# Patient Record
Sex: Male | Born: 1960 | Race: White | Hispanic: No | Marital: Married | Smoking: Former smoker
Health system: Southern US, Community
[De-identification: ages and names within clinical notes are randomized; demographics above are authoritative.]

## PROBLEM LIST (undated history)

## (undated) DIAGNOSIS — Z972 Presence of dental prosthetic device (complete) (partial): Secondary | ICD-10-CM

## (undated) DIAGNOSIS — R0683 Snoring: Secondary | ICD-10-CM

## (undated) DIAGNOSIS — J45909 Unspecified asthma, uncomplicated: Secondary | ICD-10-CM

## (undated) DIAGNOSIS — G5601 Carpal tunnel syndrome, right upper limb: Secondary | ICD-10-CM

## (undated) DIAGNOSIS — G542 Cervical root disorders, not elsewhere classified: Secondary | ICD-10-CM

## (undated) DIAGNOSIS — G1221 Amyotrophic lateral sclerosis: Secondary | ICD-10-CM

## (undated) HISTORY — PX: APPENDECTOMY: SHX54

## (undated) HISTORY — DX: Cervical root disorders, not elsewhere classified: G54.2

## (undated) HISTORY — DX: Snoring: R06.83

## (undated) HISTORY — DX: Unspecified asthma, uncomplicated: J45.909

---

## 2012-02-12 ENCOUNTER — Emergency Department (HOSPITAL_BASED_OUTPATIENT_CLINIC_OR_DEPARTMENT_OTHER)
Admission: EM | Admit: 2012-02-12 | Discharge: 2012-02-12 | Disposition: A | Discharge status: Home / Self Care | Payer: BC Managed Care – HMO | Attending: Emergency Medicine | Admitting: Emergency Medicine

## 2012-02-12 ENCOUNTER — Emergency Department (HOSPITAL_BASED_OUTPATIENT_CLINIC_OR_DEPARTMENT_OTHER): Payer: BC Managed Care – HMO

## 2012-02-12 ENCOUNTER — Encounter (HOSPITAL_BASED_OUTPATIENT_CLINIC_OR_DEPARTMENT_OTHER): Payer: Self-pay | Admitting: *Deleted

## 2012-02-12 DIAGNOSIS — H53149 Visual discomfort, unspecified: Secondary | ICD-10-CM | POA: Insufficient documentation

## 2012-02-12 DIAGNOSIS — R51 Headache: Secondary | ICD-10-CM | POA: Insufficient documentation

## 2012-02-12 MED ORDER — KETOROLAC TROMETHAMINE 30 MG/ML IJ SOLN
30.0000 mg | Freq: Once | INTRAMUSCULAR | Status: DC
  Filled 2012-02-12: qty 1

## 2012-02-12 MED ORDER — DEXAMETHASONE SODIUM PHOSPHATE 10 MG/ML IJ SOLN
10.0000 mg | Freq: Once | INTRAMUSCULAR | Status: AC
  Administered 2012-02-12: 10 mg via INTRAVENOUS
  Filled 2012-02-12: qty 1

## 2012-02-12 MED ORDER — DIPHENHYDRAMINE HCL 50 MG/ML IJ SOLN
25.0000 mg | Freq: Once | INTRAMUSCULAR | Status: AC
  Administered 2012-02-12: 25 mg via INTRAVENOUS
  Filled 2012-02-12: qty 1

## 2012-02-12 MED ORDER — ACETAMINOPHEN 325 MG PO TABS
650.0000 mg | ORAL_TABLET | Freq: Once | ORAL | Status: AC
  Administered 2012-02-12: 650 mg via ORAL
  Filled 2012-02-12 (×2): qty 1

## 2012-02-12 MED ORDER — METOCLOPRAMIDE HCL 5 MG/ML IJ SOLN
10.0000 mg | Freq: Once | INTRAMUSCULAR | Status: AC
  Administered 2012-02-12: 10 mg via INTRAVENOUS
  Filled 2012-02-12: qty 2

## 2012-02-12 MED ORDER — SODIUM CHLORIDE 0.9 % IV BOLUS (SEPSIS)
1000.0000 mL | Freq: Once | INTRAVENOUS | Status: AC
  Administered 2012-02-12: 1000 mL via INTRAVENOUS

## 2012-02-12 NOTE — ED Notes (Signed)
Pt. Was taken to CT scan and RN Earlene Plater explained to Pt. And Pt. Wife about going to CT scanner before pain meds due to he had taken Tylenol at 4pm

## 2012-02-12 NOTE — ED Provider Notes (Signed)
Medical screening examination/treatment/procedure(s) were performed by non-physician practitioner and as supervising physician I was immediately available for consultation/collaboration.  Vitali Seibert K Linker, MD 02/12/12 2215 

## 2012-02-12 NOTE — ED Provider Notes (Signed)
History     CSN: [id number]  Arrival date & time 02/12/12  1729   First MD Initiated Contact with Patient 02/12/12 1854      8:00 PM HPI Patient reports severe headache that began during intercourse. Reports pain is frontal and described as throbbing. States associated with photophobia. Reports similar headache several days ago,but not during intercourse . Denies fever, tick bite, neck pain, nausea, vomiting, abdominal pain, chest pain, shortness of breath. Patient is a 51 y.o. male presenting with headaches. The history is provided by the patient.  Headache  This is a new problem. The current episode started 1 to 2 hours ago. The problem occurs constantly. The problem has not changed since onset.The headache is associated with intercourse. The pain is located in the frontal region. The quality of the pain is described as throbbing. The pain is severe. The pain does not radiate. Pertinent negatives include no fever, no malaise/fatigue, no near-syncope, no shortness of breath, no nausea and no vomiting. He has tried acetaminophen for the symptoms. The treatment provided no relief.    History reviewed. No pertinent past medical history.  Past Surgical History  Procedure Date  . Appendectomy     History reviewed. No pertinent family history.  History  Substance Use Topics  . Smoking status: Never Smoker   . Smokeless tobacco: Not on file  . Alcohol Use: No      Review of Systems  Constitutional: Negative for fever, chills, malaise/fatigue and diaphoresis.  HENT: Negative for ear pain, congestion, sore throat, rhinorrhea, neck pain, neck stiffness and sinus pressure.   Respiratory: Negative for shortness of breath.   Cardiovascular: Negative for chest pain and near-syncope.  Gastrointestinal: Negative for nausea and vomiting.  Neurological: Positive for headaches. Negative for dizziness, seizures, syncope, facial asymmetry, speech difficulty, weakness, light-headedness and  numbness.  All other systems reviewed and are negative.    Allergies  Review of patient's allergies indicates no known allergies.  Home Medications   Current Outpatient Rx  Name Route Sig Dispense Refill  . TYLENOL ARTHRITIS EXT RELIEF PO Oral Take 2 tablets by mouth daily as needed. Patient used this medication for his headache.    . CETIRIZINE HCL 10 MG PO TABS Oral Take 10 mg by mouth daily. Patient uses this medication for his allergies.    . IBUPROFEN 200 MG PO TABS Oral Take 400 mg by mouth every 6 (six) hours as needed. Patient uses this medication for pain.      BP 149/94  Pulse 74  Temp(Src) 97.6 F (36.4 C) (Oral)  Resp 20  Ht 5\' 9"  (1.753 m)  Wt 210 lb (95.255 kg)  BMI 31.01 kg/m2  SpO2 99%  Physical Exam  Constitutional: He is oriented to person, place, and time. He appears well-developed and well-nourished.  HENT:  Head: Normocephalic and atraumatic.  Right Ear: External ear normal.  Left Ear: External ear normal.  Nose: Nose normal.  Mouth/Throat: Oropharynx is clear and moist. No oropharyngeal exudate.  Eyes: Conjunctivae and EOM are normal. Pupils are equal, round, and reactive to light. Right eye exhibits no discharge. Left eye exhibits no discharge.  Neck: Normal range of motion. Neck supple. No spinous process tenderness and no muscular tenderness present. No rigidity. Normal range of motion present. No Brudzinski's sign and no Kernig's sign noted.  Cardiovascular: Normal rate, regular rhythm and normal heart sounds.   Pulmonary/Chest: Effort normal and breath sounds normal.  Abdominal: Soft. Bowel sounds are normal.  Neurological: He  is alert and oriented to person, place, and time. No cranial nerve deficit (Tested CN III-XII). Coordination (normal finger to nose and no nystagmus. ) normal.  Skin: Skin is warm and dry. No rash noted. No erythema. No pallor.  Psychiatric: He has a normal mood and affect. His behavior is normal.    ED Course  Procedures    Ct Head Wo Contrast  02/12/2012  *RADIOLOGY REPORT*  Clinical Data:  Severe headache for 1 hour.  CT HEAD WITHOUT CONTRAST  Technique:  Contiguous axial images were obtained from the base of the skull through the vertex without contrast  Comparison:  None.  Findings:  The brain has a normal appearance without evidence for hemorrhage, acute infarction, hydrocephalus, or mass lesion.  There is no extra axial fluid collection.  The skull and paranasal sinuses are normal.  IMPRESSION: Normal CT of the head without contrast.  Original Report Authenticated By: Elsie Stain, M.D.      MDM  9:59 PM  Reports symptoms controlled with reglan, decadron and benadryl. Pain is a 1/10. Is ready for discharge. Advised return to ED for worsening symptoms. Otherwise advised he should followup with primary care physician for further evaluation of headaches if needed    Thomasene Lot, PA-C 02/12/12 2202

## 2012-02-12 NOTE — ED Notes (Signed)
Pt has a hx of an occasional H/A that is relieved with Tylenol. This one came on suddenly about an hr ago. Seen at [facility] and sent here. When he tilts his head back, pain is relieved. "Can feel pulse in temples" Denies other s/s. PERL

## 2012-02-12 NOTE — ED Notes (Signed)
Wife of pt. Asked for pain meds for Pt.   RN Earlene Plater to inform Charge Rn who will inform the ED Dr.

## 2012-02-12 NOTE — Discharge Instructions (Signed)

## 2012-03-19 DIAGNOSIS — G5601 Carpal tunnel syndrome, right upper limb: Secondary | ICD-10-CM

## 2012-03-19 HISTORY — DX: Carpal tunnel syndrome, right upper limb: G56.01

## 2012-03-30 ENCOUNTER — Other Ambulatory Visit: Payer: Self-pay | Admitting: Orthopedic Surgery

## 2012-04-02 ENCOUNTER — Encounter (HOSPITAL_BASED_OUTPATIENT_CLINIC_OR_DEPARTMENT_OTHER): Payer: Self-pay | Admitting: *Deleted

## 2012-04-06 ENCOUNTER — Encounter (HOSPITAL_BASED_OUTPATIENT_CLINIC_OR_DEPARTMENT_OTHER): Payer: Self-pay | Admitting: *Deleted

## 2012-04-06 ENCOUNTER — Encounter (HOSPITAL_BASED_OUTPATIENT_CLINIC_OR_DEPARTMENT_OTHER): Payer: Self-pay | Admitting: Anesthesiology

## 2012-04-06 ENCOUNTER — Encounter (HOSPITAL_BASED_OUTPATIENT_CLINIC_OR_DEPARTMENT_OTHER)
Admission: RE | Disposition: A | Discharge status: Home / Self Care | Payer: Self-pay | Source: Ambulatory Visit | Attending: Orthopedic Surgery

## 2012-04-06 ENCOUNTER — Ambulatory Visit (HOSPITAL_BASED_OUTPATIENT_CLINIC_OR_DEPARTMENT_OTHER)
Admission: RE | Admit: 2012-04-06 | Discharge: 2012-04-06 | Disposition: A | Discharge status: Home / Self Care | Payer: BC Managed Care – HMO | Source: Ambulatory Visit | Attending: Orthopedic Surgery | Admitting: Orthopedic Surgery

## 2012-04-06 ENCOUNTER — Ambulatory Visit (HOSPITAL_BASED_OUTPATIENT_CLINIC_OR_DEPARTMENT_OTHER): Payer: BC Managed Care – HMO | Admitting: Anesthesiology

## 2012-04-06 DIAGNOSIS — G5600 Carpal tunnel syndrome, unspecified upper limb: Secondary | ICD-10-CM | POA: Insufficient documentation

## 2012-04-06 HISTORY — PX: CARPAL TUNNEL RELEASE: SHX101

## 2012-04-06 HISTORY — DX: Carpal tunnel syndrome, right upper limb: G56.01

## 2012-04-06 HISTORY — DX: Snoring: R06.83

## 2012-04-06 HISTORY — DX: Presence of dental prosthetic device (complete) (partial): Z97.2

## 2012-04-06 SURGERY — CARPAL TUNNEL RELEASE
Anesthesia: General | Site: Hand | Laterality: Right | Wound class: Clean

## 2012-04-06 MED ORDER — MIDAZOLAM HCL 5 MG/5ML IJ SOLN
INTRAMUSCULAR | Status: DC | PRN
  Administered 2012-04-06: 2 mg via INTRAVENOUS

## 2012-04-06 MED ORDER — OXYCODONE-ACETAMINOPHEN 5-325 MG PO TABS: ORAL_TABLET | ORAL | Status: AC

## 2012-04-06 MED ORDER — FENTANYL CITRATE 0.05 MG/ML IJ SOLN
INTRAMUSCULAR | Status: DC | PRN
  Administered 2012-04-06: 50 ug via INTRAVENOUS

## 2012-04-06 MED ORDER — METOCLOPRAMIDE HCL 5 MG/ML IJ SOLN: 10.0000 mg | Freq: Once | INTRAMUSCULAR | Status: DC | PRN

## 2012-04-06 MED ORDER — METOCLOPRAMIDE HCL 5 MG/ML IJ SOLN
INTRAMUSCULAR | Status: DC | PRN
  Administered 2012-04-06: 10 mg via INTRAVENOUS

## 2012-04-06 MED ORDER — LIDOCAINE HCL (CARDIAC) 20 MG/ML IV SOLN
INTRAVENOUS | Status: DC | PRN
  Administered 2012-04-06: 40 mg via INTRAVENOUS

## 2012-04-06 MED ORDER — LACTATED RINGERS IV SOLN
INTRAVENOUS | Status: DC
  Administered 2012-04-06 (×4): via INTRAVENOUS

## 2012-04-06 MED ORDER — PROPOFOL 10 MG/ML IV EMUL
INTRAVENOUS | Status: DC | PRN
  Administered 2012-04-06: 220 mg via INTRAVENOUS

## 2012-04-06 MED ORDER — LIDOCAINE HCL 2 % IJ SOLN
INTRAMUSCULAR | Status: DC | PRN
  Administered 2012-04-06: 2 mL

## 2012-04-06 MED ORDER — OXYCODONE HCL 5 MG PO TABS: 5.0000 mg | ORAL_TABLET | Freq: Once | ORAL | Status: DC | PRN

## 2012-04-06 MED ORDER — ONDANSETRON HCL 4 MG/2ML IJ SOLN
INTRAMUSCULAR | Status: DC | PRN
  Administered 2012-04-06: 4 mg via INTRAVENOUS

## 2012-04-06 MED ORDER — DEXAMETHASONE SODIUM PHOSPHATE 4 MG/ML IJ SOLN
INTRAMUSCULAR | Status: DC | PRN
  Administered 2012-04-06: 10 mg via INTRAVENOUS

## 2012-04-06 MED ORDER — CHLORHEXIDINE GLUCONATE 4 % EX LIQD: 60.0000 mL | Freq: Once | CUTANEOUS | Status: DC

## 2012-04-06 MED ORDER — OXYCODONE HCL 5 MG/5ML PO SOLN: 5.0000 mg | Freq: Once | ORAL | Status: DC | PRN

## 2012-04-06 MED ORDER — HYDROMORPHONE HCL PF 1 MG/ML IJ SOLN: 0.2500 mg | INTRAMUSCULAR | Status: DC | PRN

## 2012-04-06 SURGICAL SUPPLY — 36 items
BANDAGE ADHESIVE 1X3 (GAUZE/BANDAGES/DRESSINGS) IMPLANT
BANDAGE ELASTIC 3 VELCRO ST LF (GAUZE/BANDAGES/DRESSINGS) ×2 IMPLANT
BLADE SURG 15 STRL LF DISP TIS (BLADE) ×1 IMPLANT
BLADE SURG 15 STRL SS (BLADE) ×1
BNDG ESMARK 4X9 LF (GAUZE/BANDAGES/DRESSINGS) IMPLANT
BRUSH SCRUB EZ PLAIN DRY (MISCELLANEOUS) ×2 IMPLANT
CLOTH BEACON ORANGE TIMEOUT ST (SAFETY) ×2 IMPLANT
CORDS BIPOLAR (ELECTRODE) ×2 IMPLANT
COVER MAYO STAND STRL (DRAPES) ×2 IMPLANT
COVER TABLE BACK 60X90 (DRAPES) ×2 IMPLANT
CUFF TOURNIQUET SINGLE 18IN (TOURNIQUET CUFF) IMPLANT
DECANTER SPIKE VIAL GLASS SM (MISCELLANEOUS) IMPLANT
DRAPE EXTREMITY T 121X128X90 (DRAPE) ×2 IMPLANT
DRAPE SURG 17X23 STRL (DRAPES) ×2 IMPLANT
GLOVE BIOGEL M STRL SZ7.5 (GLOVE) ×2 IMPLANT
GLOVE ECLIPSE 6.5 STRL STRAW (GLOVE) ×2 IMPLANT
GLOVE ORTHO TXT STRL SZ7.5 (GLOVE) ×2 IMPLANT
GOWN PREVENTION PLUS XLARGE (GOWN DISPOSABLE) ×2 IMPLANT
GOWN PREVENTION PLUS XXLARGE (GOWN DISPOSABLE) ×4 IMPLANT
NEEDLE 27GAX1X1/2 (NEEDLE) IMPLANT
PACK BASIN DAY SURGERY FS (CUSTOM PROCEDURE TRAY) ×2 IMPLANT
PAD CAST 3X4 CTTN HI CHSV (CAST SUPPLIES) ×1 IMPLANT
PADDING CAST ABS 4INX4YD NS (CAST SUPPLIES) ×1
PADDING CAST ABS COTTON 4X4 ST (CAST SUPPLIES) ×1 IMPLANT
PADDING CAST COTTON 3X4 STRL (CAST SUPPLIES) ×1
SPLINT PLASTER CAST XFAST 3X15 (CAST SUPPLIES) ×4 IMPLANT
SPLINT PLASTER XTRA FASTSET 3X (CAST SUPPLIES) ×4
SPONGE GAUZE 4X4 12PLY (GAUZE/BANDAGES/DRESSINGS) ×2 IMPLANT
STOCKINETTE 4X48 STRL (DRAPES) ×2 IMPLANT
STRIP CLOSURE SKIN 1/2X4 (GAUZE/BANDAGES/DRESSINGS) ×2 IMPLANT
SUT PROLENE 3 0 PS 2 (SUTURE) ×2 IMPLANT
SYR 3ML 23GX1 SAFETY (SYRINGE) IMPLANT
SYR CONTROL 10ML LL (SYRINGE) IMPLANT
TRAY DSU PREP LF (CUSTOM PROCEDURE TRAY) ×2 IMPLANT
UNDERPAD 30X30 INCONTINENT (UNDERPADS AND DIAPERS) ×2 IMPLANT
WATER STERILE IRR 1000ML POUR (IV SOLUTION) ×2 IMPLANT

## 2012-04-06 NOTE — Anesthesia Procedure Notes (Signed)
Procedure Name: LMA Insertion Date/Time: 04/06/2012 7:58 AM Performed by: Gar Gibbon Pre-anesthesia Checklist: Patient identified, Emergency Drugs available, Suction available and Patient being monitored Patient Re-evaluated:Patient Re-evaluated prior to inductionOxygen Delivery Method: Circle System Utilized Preoxygenation: Pre-oxygenation with 100% oxygen Intubation Type: IV induction Ventilation: Mask ventilation without difficulty LMA: LMA inserted LMA Size: 4.0 Number of attempts: 1 Airway Equipment and Method: bite block Placement Confirmation: positive ETCO2 Tube secured with: Tape Dental Injury: Teeth and Oropharynx as per pre-operative assessment

## 2012-04-06 NOTE — Transfer of Care (Signed)
Immediate Anesthesia Transfer of Care Note  Patient: James Munoz  Procedure(s) Performed: Procedure(s) (LRB): CARPAL TUNNEL RELEASE (Right)  Patient Location: PACU  Anesthesia Type: General  Level of Consciousness: sedated  Airway & Oxygen Therapy: Patient Spontanous Breathing and Patient connected to face mask oxygen  Post-op Assessment: Report given to PACU RN and Post -op Vital signs reviewed and stable  Post vital signs: Reviewed and stable  Complications: No apparent anesthesia complications

## 2012-04-06 NOTE — Anesthesia Postprocedure Evaluation (Signed)
Anesthesia Post Note  Patient: James Munoz  Procedure(s) Performed: Procedure(s) (LRB): CARPAL TUNNEL RELEASE (Right)  Anesthesia type: General  Patient location: PACU  Post pain: Pain level controlled  Post assessment: Patient's Cardiovascular Status Stable  Last Vitals:  Filed Vitals:   04/06/12 1000  BP: 108/74  Pulse: 60  Temp:   Resp: 14    Post vital signs: Reviewed and stable  Level of consciousness: alert  Complications: No apparent anesthesia complications

## 2012-04-06 NOTE — H&P (Signed)
James Munoz is an 51 y.o. male.   Chief Complaint: Right hand numbness. HPI: 51 year old male with median numbness.  NCV positive for carpal tunnel syndrome.  Past Medical History  Diagnosis Date  . Dental bridge present     lower  . Snores     denies apnea or waking up gasping/choking  . Carpal tunnel syndrome of right wrist 03/2012    weakness and decreased motor skills right hand    Past Surgical History  Procedure Date  . Appendectomy     History reviewed. No pertinent family history. Social History:  reports that he has never smoked. He has never used smokeless tobacco. He reports that he does not drink alcohol or use illicit drugs.  Allergies: No Known Allergies  Medications Prior to Admission  Medication Sig Dispense Refill  . Acetaminophen (TYLENOL ARTHRITIS EXT RELIEF PO) Take 2 tablets by mouth daily as needed. Patient used this medication for his headache.      Marland Kitchen acetaminophen (TYLENOL) 325 MG tablet Take 650 mg by mouth every 6 (six) hours as needed.      . cetirizine (ZYRTEC) 10 MG tablet Take 10 mg by mouth daily. Patient uses this medication for his allergies.      . Multiple Vitamin (MULTIVITAMIN) tablet Take 1 tablet by mouth daily.      Marland Kitchen ibuprofen (ADVIL,MOTRIN) 200 MG tablet Take 400 mg by mouth every 6 (six) hours as needed. Patient uses this medication for pain.        Results for orders placed during the hospital encounter of 04/06/12 (from the past 48 hour(s))  POCT HEMOGLOBIN-HEMACUE     Status: Normal   Collection Time   04/06/12  6:52 AM      Component Value Range Comment   Hemoglobin 13.8  13.0 - 17.0 g/dL    No results found.  Review of Systems  Constitutional: Negative.   Eyes: Negative.   Cardiovascular: Negative.   Gastrointestinal: Negative.   Genitourinary: Negative.   Musculoskeletal: Negative.   Skin: Negative.   Neurological: Negative.   Endo/Heme/Allergies: Negative.     Blood pressure 135/97, pulse 65, temperature 97.7 F  (36.5 C), temperature source Oral, resp. rate 16, height 5' 9.5" (1.765 m), weight 95.255 kg (210 lb), SpO2 97.00%. Physical Exam  Constitutional: He is oriented to person, place, and time. He appears well-developed and well-nourished.  HENT:  Head: Normocephalic and atraumatic.  Eyes: Conjunctivae are normal. Pupils are equal, round, and reactive to light.  Neck: Normal range of motion.  Cardiovascular: Normal rate and regular rhythm.   Respiratory: Effort normal and breath sounds normal.  GI: Soft. Bowel sounds are normal.  Musculoskeletal:       Right hand numbness in median distribution  Neurological: He is alert and oriented to person, place, and time.  Skin: Skin is warm and dry.  Psychiatric: He has a normal mood and affect.     Assessment/Plan Right carpal tunnel syndrome. Right carpal tunnel release.  Questions invited and answered.  Randell Detter JR,Kamie Korber V 04/06/2012, 7:39 AM

## 2012-04-06 NOTE — Brief Op Note (Signed)
04/06/2012  8:22 AM  PATIENT:  Clarisse Gouge  51 y.o. male  PRE-OPERATIVE DIAGNOSIS:  BILATERAL CARPAL TUNNEL SYNDROME  POST-OPERATIVE DIAGNOSIS:  bilateral carpel tunnel syndrome  PROCEDURE:  Procedure(s) (LRB): CARPAL TUNNEL RELEASE (Right)  SURGEON:  Surgeon(s) and Role:    * Wyn Forster., MD - Primary  PHYSICIAN ASSISTANT:   ASSISTANTS: Surgical assistant   ANESTHESIA:   general  EBL:     BLOOD ADMINISTERED:none  DRAINS: none   LOCAL MEDICATIONS USED:  LIDOCAINE   SPECIMEN:  No Specimen  DISPOSITION OF SPECIMEN:  N/A  COUNTS:  YES  TOURNIQUET:   Total Tourniquet Time Documented: Upper Arm (Right) - 9 minutes  DICTATION: .Other Dictation: Dictation Number [id number]  PLAN OF CARE: Discharge to home after PACU  PATIENT DISPOSITION:  PACU - hemodynamically stable.

## 2012-04-06 NOTE — Op Note (Signed)
[id number] 

## 2012-04-06 NOTE — Op Note (Signed)
NAMEOLYVER, HAWES             ACCOUNT NO.:  [id number]  MEDICAL RECORD NO.:  [id number]  LOCATION:                                 FACILITY:  PHYSICIAN:  Katy Fitch. Akya Fiorello, M.D.      DATE OF BIRTH:  DATE OF PROCEDURE:  04/06/2012 DATE OF DISCHARGE:                              OPERATIVE REPORT   PREOPERATIVE DIAGNOSIS:  Bilateral carpal tunnel syndrome.  POSTOPERATIVE DIAGNOSIS:  Bilateral carpal tunnel syndrome.  OPERATION:  Release of right transverse carpal ligament.  OPERATING SURGEON:  Katy Fitch. Nahal Wanless, MD.  ANESTHESIA:  General by LMA.  SUPERVISING ANESTHESIOLOGIST:  Janetta Hora. Gelene Mink, MD.  INDICATIONS:  James Munoz is a 51 year old gentleman employed by [employer] who was referred through the courtesy of Dr. Rodrigo Ran for evaluation of bilateral hand discomfort and numbness. Clinical examination suggested carpal tunnel syndrome. Electrodiagnostic studies completed by Dr. Phill Myron confirmed significant bilateral median neuropathy.  Due to a failure to respond to nonoperative measures, he is brought to the operating room at this time for release of his right transverse carpal ligament.  Preoperatively, we explained to him the technique of carpal tunnel release.  He understands the potential risks and benefits of surgery. There is always a chance that all of his symptoms will not be relieved. He may have a painful scar.  There is a remote chance of neurovascular injury.  After informed consent, he was brought to the operating room at this time.  PROCEDURE IN DETAIL:  Luismiguel Lamere is brought to room 1 at the [facility] and placed in supine position upon the operating table.  Following the induction of general anesthesia by LMA technique under Dr. Thornton Dales direct supervision, the right arm was prepped with Betadine soap and solution, sterilely draped.  A pneumatic tourniquet was applied to the proximal right  brachium.  Following exsanguination of the right arm with Esmarch bandage, the arterial tourniquet on the proximal brachium was inflated to 220 mmHg. A routine surgical time-out was accomplished followed by proceeding with a short incision in the line of the ring finger and the palm. Subcutaneous tissues were carefully divided revealing the palmar fascia. This was split longitudinally to reveal the common sensory branch of the median nerve and the superficial palmar arch.  The distal margin of the transverse carpal ligament was isolated followed by identification of the median nerve proper.  A Penfield 4 elevator was passed deep to the transverse carpal ligament and scissors passed superficial to the transverse carpal ligament.  After a pathway had been created both superficial and deep with extreme care and direct visualization, we released the transverse carpal ligament along its ulnar border extending into the distal forearm.  The volar arm fascia was released several centimeters into the distal forearm.  There was a small accessory transverse carpal ligament about 3 cm proximal.  This did not appear to be causing significant compression. We did not attempt subcutaneous release of this ligament.  The carpal canal was substantially widened.  The ulnar bursa was fibrotic and opaque.  No masses were identified.  Bleeding points along the margin of the released ligament were electrocauterized with bipolar current followed by repair of  the skin with intradermal 3-0 Prolene and Steri-Strips.  Lidocaine 2% was infiltrated for postoperative analgesia.  Total of 2 mL was utilized.  The hand was then dressed with sterile gauze, sterile Webril, and a volar plaster splint maintaining the wrist in 15 degrees of dorsiflexion.  For aftercare, Mr. Laura is provided a prescription for Percocet 5 mg 1 p.o. q.4-6 hours p.r.n. pain, 20 tablets without a refill.     Katy Fitch Djon Tith,  M.D.     RVS/MEDQ  D:  04/06/2012  T:  04/06/2012  Job:  [id number]

## 2012-04-06 NOTE — Anesthesia Preprocedure Evaluation (Signed)
Anesthesia Evaluation  Patient identified by MRN, date of birth, ID band Patient awake    Reviewed: Allergy & Precautions, H&P , NPO status , Patient's Chart, lab work & pertinent test results, reviewed documented beta blocker date and time   Airway Mallampati: II TM Distance: >3 FB Neck ROM: full    Dental   Pulmonary neg pulmonary ROS,  breath sounds clear to auscultation        Cardiovascular negative cardio ROS  Rhythm:regular     Neuro/Psych  Neuromuscular disease negative psych ROS   GI/Hepatic negative GI ROS, Neg liver ROS,   Endo/Other  negative endocrine ROS  Renal/GU negative Renal ROS  negative genitourinary   Musculoskeletal   Abdominal   Peds  Hematology negative hematology ROS (+)   Anesthesia Other Findings See surgeon's H&P   Reproductive/Obstetrics negative OB ROS                           Anesthesia Physical Anesthesia Plan  ASA: I  Anesthesia Plan: General   Post-op Pain Management:    Induction: Intravenous  Airway Management Planned: LMA  Additional Equipment:   Intra-op Plan:   Post-operative Plan: Extubation in OR  Informed Consent: I have reviewed the patients History and Physical, chart, labs and discussed the procedure including the risks, benefits and alternatives for the proposed anesthesia with the patient or authorized representative who has indicated his/her understanding and acceptance.   Dental Advisory Given  Plan Discussed with: CRNA and Surgeon  Anesthesia Plan Comments:         Anesthesia Quick Evaluation  

## 2012-04-09 ENCOUNTER — Encounter (HOSPITAL_BASED_OUTPATIENT_CLINIC_OR_DEPARTMENT_OTHER): Payer: Self-pay | Admitting: Orthopedic Surgery

## 2012-08-14 ENCOUNTER — Other Ambulatory Visit: Payer: Self-pay | Admitting: Orthopedic Surgery

## 2012-08-15 NOTE — Progress Notes (Signed)
Pt here 7/13 rt ctr-now has cyst and ulnar nerve no labs needed-did well with gen anesth

## 2012-08-20 NOTE — H&P (Signed)
James Munoz is an 51 y.o. male.   Chief Complaint: c/o persistent weakness of thumb opposition right hand S/P right CTR HPI: James Munoz returns for follow-up examination of his right carpal tunnel release performed April 06, 2012.  He is four months post-op.  James Munoz's scar is well healed.  He has normal sensibility in his median innervated fingers.  He reports, however, that he has had persistent impairment of thumb positioning.   Past Medical History  Diagnosis Date  . Dental bridge present     lower  . Snores     denies apnea or waking up gasping/choking  . Carpal tunnel syndrome of right wrist 03/2012    weakness and decreased motor skills right hand    Past Surgical History  Procedure Date  . Appendectomy   . Carpal tunnel release 04/06/2012    Procedure: CARPAL TUNNEL RELEASE;  Surgeon: Wyn Forster., MD;  Location: [facility];  Service: Orthopedics;  Laterality: Right;    No family history on file. Social History:  reports that he has never smoked. He has never used smokeless tobacco. He reports that he does not drink alcohol or use illicit drugs.  Allergies: No Known Allergies  No prescriptions prior to admission    No results found for this or any previous visit (from the past 48 hour(s)).  No results found.   Pertinent items are noted in HPI.  Height 5' 9.5" (1.765 m), weight 95.255 kg (210 lb).  General appearance: alert Head: Normocephalic, without obvious abnormality Neck: supple, symmetrical, trachea midline Resp: clear to auscultation bilaterally Cardio: regular rate and rhythm GI: normal findings: bowel sounds normal Extremities: On exam when he attempts a grasp pattern he has weak opposition. He has normal bulk of his abductor pollicis brevis and flexor pollicis brevis, however, on careful inspection of his hand and various attempts at activating the thenar muscles he does appear to have a degree of atrophy of his opponens pollicis  muscle.  It takes extremely careful examination to discern this. With attempted opposition or lateral pinch there is some muscle in the region of the opponens pollicis, but he does not have the typical distal bulk.   There is no visible fasciculation.  He has negative Hoffman maneuver. He does not have any residual numbness or signs of intrinsic atrophy.   We had Dr. Johna Roles repeat comprehensive EMG nerve conduction evaluation in the median and ulnar nerves.   Compared to the study of June, 2013, James Munoz has an abnormal ulnar conduction velocity across the elbow. He also has slower conduction parameters of his median nerve across the wrist and a decreased median motor amplitude which is a bit puzzling.  Sensory latency studies are slightly better, the lumbrical interosseous difference is worse on the right.  Dr. Johna Roles performed detailed EMG studies of the right flexor pollicis longus, right extensor indices proprius, the pronator quadratus, abductor pollicis brevis, right opponens, right adductor pollicis, right first dorsal interosseous, left abductor digiti minimi and left opponens, all of which were normal.    We are thankful to see that there are no fibrillations or sharp waves that would suggest a possible inadvertent axonotmesis or neurapraxia.     MRI of his right wrist completed at [facility] 08/08/12, interpreted by Corrie Dandy L. Vear Clock, MD.   The MRI raises even more questions.  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 He also has an effusion in  his wrist joint, some edema in his lunate, a cyst on the radial aspect of the lunate consistent with a partial scapholunate interosseous ligament injury.  It is possible that he is developing the early stages of Kienbck's disease or has had a chronic scapholunate instability that has gradually worsened leading to the development of a volar deep juxtaarticular cyst and compression.     Pulses: 2+ and  symmetric Skin: normal Neurologic: Grossly normal except right hand as above.    Assessment/Plan Impression:  Weak opposition right thumb S/P right CTR July 2013, with persistent abnormal nerve conduction study of median nerve post operatively at four months.  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  Detailed EMG of the thenar muscles has revealed normal parameters.  Plan: To the OR for exploration right carpal canal and direct inspection of median nerve and motor branch to thenar muscles. Volar cyst excision and decompression right ulnar nerve.The procedures, risks,benefits and post-op course were discussed with the patient at length and they were in agreement with the plan. A second opinion was advised and scheduled preoperatively.  James Munoz 08/20/2012, 3:52 PM   James Munoz.A-C

## 2012-08-21 ENCOUNTER — Ambulatory Visit (HOSPITAL_BASED_OUTPATIENT_CLINIC_OR_DEPARTMENT_OTHER)
Admission: RE | Admit: 2012-08-21 | Discharge: 2012-08-21 | Disposition: A | Discharge status: Home / Self Care | Payer: BC Managed Care – HMO | Source: Ambulatory Visit | Attending: Orthopedic Surgery | Admitting: Orthopedic Surgery

## 2012-08-21 ENCOUNTER — Encounter (HOSPITAL_BASED_OUTPATIENT_CLINIC_OR_DEPARTMENT_OTHER): Payer: Self-pay | Admitting: Certified Registered Nurse Anesthetist

## 2012-08-21 ENCOUNTER — Encounter (HOSPITAL_BASED_OUTPATIENT_CLINIC_OR_DEPARTMENT_OTHER): Payer: Self-pay | Admitting: Anesthesiology

## 2012-08-21 ENCOUNTER — Encounter (HOSPITAL_BASED_OUTPATIENT_CLINIC_OR_DEPARTMENT_OTHER)
Admission: RE | Disposition: A | Discharge status: Home / Self Care | Payer: Self-pay | Source: Ambulatory Visit | Attending: Orthopedic Surgery

## 2012-08-21 ENCOUNTER — Encounter (HOSPITAL_BASED_OUTPATIENT_CLINIC_OR_DEPARTMENT_OTHER): Payer: Self-pay | Admitting: *Deleted

## 2012-08-21 ENCOUNTER — Ambulatory Visit (HOSPITAL_BASED_OUTPATIENT_CLINIC_OR_DEPARTMENT_OTHER): Payer: BC Managed Care – HMO | Admitting: Anesthesiology

## 2012-08-21 DIAGNOSIS — G5620 Lesion of ulnar nerve, unspecified upper limb: Secondary | ICD-10-CM | POA: Insufficient documentation

## 2012-08-21 DIAGNOSIS — G5610 Other lesions of median nerve, unspecified upper limb: Secondary | ICD-10-CM | POA: Insufficient documentation

## 2012-08-21 DIAGNOSIS — M6749 Ganglion, multiple sites: Secondary | ICD-10-CM | POA: Insufficient documentation

## 2012-08-21 HISTORY — PX: TENDON EXPLORATION: SHX5112

## 2012-08-21 HISTORY — PX: ULNAR NERVE TRANSPOSITION: SHX2595

## 2012-08-21 SURGERY — EXPLORATION, TENDON
Anesthesia: General | Site: Wrist | Laterality: Right | Wound class: Clean

## 2012-08-21 MED ORDER — MIDAZOLAM HCL 2 MG/2ML IJ SOLN: 0.5000 mg | Freq: Once | INTRAMUSCULAR | Status: DC | PRN

## 2012-08-21 MED ORDER — CEFAZOLIN SODIUM-DEXTROSE 2-3 GM-% IV SOLR
2.0000 g | Freq: Once | INTRAVENOUS | Status: AC
  Administered 2012-08-21: 2 g via INTRAVENOUS

## 2012-08-21 MED ORDER — MIDAZOLAM HCL 2 MG/2ML IJ SOLN: 1.0000 mg | INTRAMUSCULAR | Status: DC | PRN

## 2012-08-21 MED ORDER — PROMETHAZINE HCL 25 MG/ML IJ SOLN: 6.2500 mg | INTRAMUSCULAR | Status: DC | PRN

## 2012-08-21 MED ORDER — CHLORHEXIDINE GLUCONATE 4 % EX LIQD: 60.0000 mL | Freq: Once | CUTANEOUS | Status: DC

## 2012-08-21 MED ORDER — MEPERIDINE HCL 25 MG/ML IJ SOLN: 6.2500 mg | INTRAMUSCULAR | Status: DC | PRN

## 2012-08-21 MED ORDER — 0.9 % SODIUM CHLORIDE (POUR BTL) OPTIME
TOPICAL | Status: DC | PRN
  Administered 2012-08-21: 600 mL

## 2012-08-21 MED ORDER — FENTANYL CITRATE 0.05 MG/ML IJ SOLN: 50.0000 ug | INTRAMUSCULAR | Status: DC | PRN

## 2012-08-21 MED ORDER — OXYCODONE HCL 5 MG PO TABS
5.0000 mg | ORAL_TABLET | Freq: Once | ORAL | Status: AC | PRN
  Administered 2012-08-21: 5 mg via ORAL

## 2012-08-21 MED ORDER — HYDROMORPHONE HCL PF 1 MG/ML IJ SOLN
0.2500 mg | INTRAMUSCULAR | Status: DC | PRN
  Administered 2012-08-21: 0.5 mg via INTRAVENOUS

## 2012-08-21 MED ORDER — CEPHALEXIN 500 MG PO CAPS: 500.0000 mg | ORAL_CAPSULE | Freq: Three times a day (TID) | ORAL | Status: DC

## 2012-08-21 MED ORDER — LIDOCAINE HCL 2 % IJ SOLN
INTRAMUSCULAR | Status: DC | PRN
  Administered 2012-08-21: 3 mL

## 2012-08-21 MED ORDER — LIDOCAINE HCL (CARDIAC) 20 MG/ML IV SOLN
INTRAVENOUS | Status: DC | PRN
  Administered 2012-08-21: 40 mg via INTRAVENOUS

## 2012-08-21 MED ORDER — LACTATED RINGERS IV SOLN
INTRAVENOUS | Status: DC
  Administered 2012-08-21: 14:00:00 via INTRAVENOUS
  Administered 2012-08-21: 20 mL/h via INTRAVENOUS

## 2012-08-21 MED ORDER — MIDAZOLAM HCL 5 MG/5ML IJ SOLN
INTRAMUSCULAR | Status: DC | PRN
  Administered 2012-08-21: 2 mg via INTRAVENOUS

## 2012-08-21 MED ORDER — FENTANYL CITRATE 0.05 MG/ML IJ SOLN
INTRAMUSCULAR | Status: DC | PRN
  Administered 2012-08-21: 50 ug via INTRAVENOUS
  Administered 2012-08-21: 25 ug via INTRAVENOUS
  Administered 2012-08-21: 100 ug via INTRAVENOUS
  Administered 2012-08-21: 25 ug via INTRAVENOUS

## 2012-08-21 MED ORDER — DEXAMETHASONE SODIUM PHOSPHATE 4 MG/ML IJ SOLN
INTRAMUSCULAR | Status: DC | PRN
  Administered 2012-08-21: 10 mg via INTRAVENOUS

## 2012-08-21 MED ORDER — OXYCODONE HCL 5 MG/5ML PO SOLN: 5.0000 mg | Freq: Once | ORAL | Status: AC | PRN

## 2012-08-21 MED ORDER — METOCLOPRAMIDE HCL 5 MG/ML IJ SOLN
INTRAMUSCULAR | Status: DC | PRN
  Administered 2012-08-21: 10 mg via INTRAVENOUS

## 2012-08-21 MED ORDER — OXYCODONE-ACETAMINOPHEN 5-325 MG PO TABS: ORAL_TABLET | ORAL | Status: DC

## 2012-08-21 MED ORDER — PROPOFOL 10 MG/ML IV BOLUS
INTRAVENOUS | Status: DC | PRN
  Administered 2012-08-21: 200 mg via INTRAVENOUS

## 2012-08-21 SURGICAL SUPPLY — 45 items
BANDAGE ADHESIVE 1X3 (GAUZE/BANDAGES/DRESSINGS) ×3 IMPLANT
BANDAGE ELASTIC 3 VELCRO ST LF (GAUZE/BANDAGES/DRESSINGS) ×3 IMPLANT
BANDAGE ELASTIC 4 VELCRO ST LF (GAUZE/BANDAGES/DRESSINGS) ×3 IMPLANT
BANDAGE GAUZE ELAST BULKY 4 IN (GAUZE/BANDAGES/DRESSINGS) ×3 IMPLANT
BLADE MINI RND TIP GREEN BEAV (BLADE) IMPLANT
BLADE SURG 15 STRL LF DISP TIS (BLADE) ×2 IMPLANT
BLADE SURG 15 STRL SS (BLADE) ×1
BNDG ESMARK 4X9 LF (GAUZE/BANDAGES/DRESSINGS) ×3 IMPLANT
BRUSH SCRUB EZ PLAIN DRY (MISCELLANEOUS) ×3 IMPLANT
CLOTH BEACON ORANGE TIMEOUT ST (SAFETY) ×3 IMPLANT
CORDS BIPOLAR (ELECTRODE) ×3 IMPLANT
COVER MAYO STAND STRL (DRAPES) ×3 IMPLANT
COVER TABLE BACK 60X90 (DRAPES) ×3 IMPLANT
CUFF TOURNIQUET SINGLE 18IN (TOURNIQUET CUFF) ×3 IMPLANT
DECANTER SPIKE VIAL GLASS SM (MISCELLANEOUS) IMPLANT
DRAPE EXTREMITY T 121X128X90 (DRAPE) ×3 IMPLANT
DRAPE SURG 17X23 STRL (DRAPES) ×3 IMPLANT
DRSG EMULSION OIL 3X3 NADH (GAUZE/BANDAGES/DRESSINGS) ×3 IMPLANT
DRSG TEGADERM 4X4.75 (GAUZE/BANDAGES/DRESSINGS) ×3 IMPLANT
GAUZE SPONGE 4X4 12PLY STRL LF (GAUZE/BANDAGES/DRESSINGS) ×3 IMPLANT
GLOVE BIOGEL M STRL SZ7.5 (GLOVE) ×3 IMPLANT
GLOVE ORTHO TXT STRL SZ7.5 (GLOVE) ×3 IMPLANT
GOWN PREVENTION PLUS XLARGE (GOWN DISPOSABLE) ×3 IMPLANT
GOWN PREVENTION PLUS XXLARGE (GOWN DISPOSABLE) ×6 IMPLANT
LOOP VESSEL MAXI BLUE (MISCELLANEOUS) ×3 IMPLANT
NEEDLE 27GAX1X1/2 (NEEDLE) ×3 IMPLANT
PACK BASIN DAY SURGERY FS (CUSTOM PROCEDURE TRAY) ×3 IMPLANT
PADDING CAST ABS 4INX4YD NS (CAST SUPPLIES) ×1
PADDING CAST ABS COTTON 4X4 ST (CAST SUPPLIES) ×2 IMPLANT
SLEEVE SCD COMPRESS KNEE MED (MISCELLANEOUS) IMPLANT
SPONGE GAUZE 4X4 12PLY (GAUZE/BANDAGES/DRESSINGS) ×3 IMPLANT
STOCKINETTE 4X48 STRL (DRAPES) ×3 IMPLANT
STRIP CLOSURE SKIN 1/2X4 (GAUZE/BANDAGES/DRESSINGS) ×3 IMPLANT
SUT PROLENE 3 0 PS 2 (SUTURE) ×3 IMPLANT
SUT VIC AB 3-0 SH 27 (SUTURE)
SUT VIC AB 3-0 SH 27XBRD (SUTURE) IMPLANT
SUT VIC AB 4-0 P-3 18XBRD (SUTURE) ×2 IMPLANT
SUT VIC AB 4-0 P3 18 (SUTURE) ×1
SUT VICRYL 3-0 RB1 18 ABS (SUTURE) ×3 IMPLANT
SYR 3ML 23GX1 SAFETY (SYRINGE) IMPLANT
SYR BULB 3OZ (MISCELLANEOUS) IMPLANT
SYR CONTROL 10ML LL (SYRINGE) ×3 IMPLANT
TOWEL OR 17X24 6PK STRL BLUE (TOWEL DISPOSABLE) ×9 IMPLANT
UNDERPAD 30X30 INCONTINENT (UNDERPADS AND DIAPERS) ×3 IMPLANT
WATER STERILE IRR 1000ML POUR (IV SOLUTION) ×3 IMPLANT

## 2012-08-21 NOTE — Anesthesia Postprocedure Evaluation (Signed)
  Anesthesia Post-op Note  Patient: James Munoz  Procedure(s) Performed: Procedure(s) (LRB) with comments: TENDON EXPLORATION (Right) - Wide Exploration of Carpal Canal , Cyst Excision Right   ULNAR NERVE DECOMPRESSION/TRANSPOSITION (Right) - Decompression Ulnar Nerve    Patient Location: PACU  Anesthesia Type:General  Level of Consciousness: awake, alert , oriented and patient cooperative  Airway and Oxygen Therapy: Patient Spontanous Breathing  Post-op Pain: mild  Post-op Assessment: Post-op Vital signs reviewed, Patient's Cardiovascular Status Stable, Respiratory Function Stable, Patent Airway, No signs of Nausea or vomiting and Pain level controlled  Post-op Vital Signs: Reviewed and stable  Complications: No apparent anesthesia complications

## 2012-08-21 NOTE — Transfer of Care (Signed)
Immediate Anesthesia Transfer of Care Note  Patient: James Munoz  Procedure(s) Performed: Procedure(s) (LRB) with comments: TENDON EXPLORATION (Right) - Wide Exploration of Carpal Canal , Cyst Excision Right   ULNAR NERVE DECOMPRESSION/TRANSPOSITION (Right) - Decompression Ulnar Nerve    Patient Location: PACU  Anesthesia Type:General  Level of Consciousness: awake, alert , oriented and patient cooperative  Airway & Oxygen Therapy: Patient Spontanous Breathing and Patient connected to face mask oxygen  Post-op Assessment: Report given to PACU RN and Post -op Vital signs reviewed and stable  Post vital signs: Reviewed and stable  Complications: No apparent anesthesia complications

## 2012-08-21 NOTE — Anesthesia Procedure Notes (Signed)
Procedure Name: LMA Insertion Date/Time: 08/21/2012 1:17 PM Performed by: Norva Pavlov Pre-anesthesia Checklist: Patient identified, Emergency Drugs available, Suction available and Patient being monitored Patient Re-evaluated:Patient Re-evaluated prior to inductionOxygen Delivery Method: Circle System Utilized Preoxygenation: Pre-oxygenation with 100% oxygen Intubation Type: IV induction Ventilation: Mask ventilation without difficulty LMA: LMA inserted LMA Size: 4.0 Number of attempts: 1 Airway Equipment and Method: bite block Placement Confirmation: positive ETCO2 Tube secured with: Tape Dental Injury: Teeth and Oropharynx as per pre-operative assessment

## 2012-08-21 NOTE — Op Note (Signed)
[id number] 

## 2012-08-21 NOTE — Anesthesia Preprocedure Evaluation (Signed)
Anesthesia Evaluation  Patient identified by MRN, date of birth, ID band Patient awake    Reviewed: Allergy & Precautions, H&P , NPO status , Patient's Chart, lab work & pertinent test results  History of Anesthesia Complications Negative for: history of anesthetic complications  Airway Mallampati: II TM Distance: >3 FB Neck ROM: Full    Dental  (+) Caps and Dental Advisory Given   Pulmonary neg pulmonary ROS, former smoker,  breath sounds clear to auscultation  Pulmonary exam normal       Cardiovascular negative cardio ROS  Rhythm:Regular Rate:Normal     Neuro/Psych negative neurological ROS  negative psych ROS   GI/Hepatic negative GI ROS, Neg liver ROS,   Endo/Other  negative endocrine ROS  Renal/GU negative Renal ROS     Musculoskeletal   Abdominal   Peds  Hematology negative hematology ROS (+)   Anesthesia Other Findings   Reproductive/Obstetrics                           Anesthesia Physical Anesthesia Plan  ASA: I  Anesthesia Plan: General   Post-op Pain Management:    Induction: Intravenous  Airway Management Planned: LMA  Additional Equipment:   Intra-op Plan:   Post-operative Plan:   Informed Consent: I have reviewed the patients History and Physical, chart, labs and discussed the procedure including the risks, benefits and alternatives for the proposed anesthesia with the patient or authorized representative who has indicated his/her understanding and acceptance.   Dental advisory given  Plan Discussed with: CRNA and Surgeon  Anesthesia Plan Comments: (Patient declines regional anesthesia.  Plan routine monitors, GA- LMA OK)        Anesthesia Quick Evaluation

## 2012-08-21 NOTE — Brief Op Note (Signed)
08/21/2012  2:50 PM  PATIENT:  James Munoz  51 y.o. 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" Ulnar Nerve Impairment Right Elbow, Volar Cyst  PROCEDURE:  Exploration and neurolysis of median nerve from distal forearm to thenar muscles, Ulnar nerve in situ decompression, drainage and curretage of volar myxoid cyst  SURGEON:  Surgeon(s) and Role:    * Wyn Forster., MD - Primary  PHYSICIAN ASSISTANT:   ASSISTANTS: Mallory Shirk.A-C    ANESTHESIA:   general  EBL:  Total I/O In: 1000 [I.V.:1000] Out: -   BLOOD ADMINISTERED:none  DRAINS: none   LOCAL MEDICATIONS USED:  XYLOCAINE   SPECIMEN:  No Specimen  DISPOSITION OF SPECIMEN:  N/A  COUNTS:  correct  TOURNIQUET:   Total Tourniquet Time Documented: Upper Arm (Right) - 49 minutes  DICTATION: .Other Dictation: Dictation Number [id number]  PLAN OF CARE: Discharge to home after PACU  PATIENT DISPOSITION:  PACU - hemodynamically stable.

## 2012-08-22 ENCOUNTER — Encounter (HOSPITAL_BASED_OUTPATIENT_CLINIC_OR_DEPARTMENT_OTHER): Payer: Self-pay | Admitting: Orthopedic Surgery

## 2012-08-22 NOTE — Op Note (Signed)
NAMEMIDAS, DAUGHETY             ACCOUNT NO.:  [id number]  MEDICAL RECORD NO.:  [id number]  LOCATION:                                 FACILITY:  PHYSICIAN:  Katy Fitch. Zaniya Mcaulay, M.D. DATE OF BIRTH:  May 04, 1961  DATE OF PROCEDURE:  08/21/2012 DATE OF DISCHARGE:                              OPERATIVE REPORT   PREOPERATIVE DIAGNOSIS:  Failure to improve median nerve function status post carpal tunnel release, April 06, 2012 with EMG nerve conduction study documenting BK in motor and sensory latencies of median nerve, and normal EMG of the thenar muscles, also worsening of ulnar nerve conduction parameters across right elbow.  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  OPERATION: 1. Exploration and neurolysis of median nerve from distal forearm to     the thenar motor branch to mid palm, identifying profound scarring     at the level of transverse carpal ligament, likely due to prior     postoperative bleed with considerable neurodesis. 2. 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  This was debrided. 3. 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  OPERATING SURGEON:  Katy Fitch. Annalisse Minkoff, MD  ASSISTANT:  Marveen Reeks. Dasnoit, PA-C.  ANESTHESIA:  General by LMA.  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  Clinical examination and electrodiagnostic studies confirmed bilateral moderate carpal tunnel syndrome.  We recommended simple carpal tunnel release.  We proceeded with surgery on the right side, April 06, 2012. Postoperatively, he had recovery of his sensibility in his fingers but had some soreness at his thenar eminence and had some weakness of opposition.  We monitored his progress and noted complete recovery of sensibility in the first 2 months postoperatively, however, he continued to complain of weak pinch.  There was no evidence of thenar atrophy.  We placed him in therapy and observed him for another month and then due to a failure to improve, we repeated electrodiagnostic studies and also an EMG of the thenar muscles.  The EMG of the thenar muscles, comparing right to left was entirely normal.  He did appear to have clinical weakness of his opponens pollicis muscle.  Despite direct needling, we could not objectively document neuropathic injury to the opponens pollicis, therefore we obtained an MRI.  The MRI was interesting in that he had edema of his lunate, edema at the radiocarpal articulation, moderate-sized myxoid cyst on the volar radiocarpal ligaments at the radioscaphocapitate and short radiolunate ligaments.  It is possible that the cyst was contributing to his persistent carpal tunnel symptoms.  Due to deterioration in his motor and sensory latencies, and due to his thenar weakness, we recommended wide exploration.  We offered referral to [facility] and also encouraged him to obtain a second opinion prior to proceeding with second surgery.  In  addition to the median nerve exploration, I advised him to proceed with decompression of the ulnar nerve at the cubital tunnel in an effort to assure that there was not a anatomic anastomosis between the thenar muscles and the ulnar nerve in the hand that could be affected by his progressively worsening ulnar nerve function on electrodiagnostic studies.  We also stated that we would examine the volar cyst and curetted/excise it in an effort to relieve all space-occupying lesions in the region of the carpal  canal.  Preoperatively, questions were invited and answered in detail.  Mr. Demarest decided he would like to proceed with surgery at this time.  We also discussed the possibility of performing Camitz transfer or other opponensplasty in an effort to augment his pinch if we were unable to resolve his weakness.  At the present time prior to surgery, we did not have a clear understanding of the pathophysiology of this thenar weakness.  Questions regarding the anticipated surgery invited and answered.  He understands this is a best effort on our part to ascertain the reason for his weakness.  It is possible that further reconstructive surgery in the form of a opponensplasty might require later date.  We will have to make an intraoperative judgment to decide whether not it is appropriate to perform surgery at this time.  PROCEDURE:  Leshaun Biebel was brought to room 6 of the [facility] and placed in supine position on the operating table. Preoperatively, he has been interviewed by Dr. Jairo Ben and general anesthesia by LMA technique was recommended and accepted.  In room 6 under Dr. Edison Pace supervision, general anesthesia by LMA technique was induced followed by routine Betadine scrub and paint to the right upper extremity.  A pneumatic tourniquet was applied to the proximal brachium at 220 mmHg.  Following exsanguination of right arm with Esmarch bandage, arterial tourniquet was inflated to 220 mmHg.  A 2 g of Ancef administered as an IV prophylactic antibiotic.  Following routine surgical time-out, the procedure commenced with resection of prior surgical scar.  The exploration was extended from the mid palm paralleling the thenar crease, approximately 4 cm into the distal forearm with a Brunner zigzag incision across the wrist flexion crease.  Subcutaneous tissues were carefully divided taking care to identify the volar forearm fascia.  It was released proximally 4  cm above the distal wrist flexion crease.  The median nerve identified at this level and care was taken to identify and spare the post palmar cutaneous branch.  Distal dissection revealed very severe scarring at the level of the proximal margin of transverse carpal ligament.  We gradually released the transverse carpal ligament along its ulnar border off the hook of the hamate, and peel back a flap of the ligament and the neurodesed nerve.  We subsequently identified the median nerve to its common sensory branch to the ring and long fingers, and performed a meticulous external neurolysis until the common sensory branch to the long and index, as well as the radial proper digital nerve of the index finger and the thenar motor branches were identified.  The nerve was profoundly scarred to the transverse carpal ligament.  It appeared that Mr. Broski had a postoperative bleed and had hematoma formed causing dense adhesions.  Following completion of the neurolysis, he was noted to have excellent mobility of the nerve with wrist flexion and extension.  We then examined the region of the cyst noted on the MRI. There was a bulging of  the volar capsular ligaments.  These were spread between the line of the fibers and mucinous material recovered the joint was exposed and a micro-rongeur was used to perform a synovectomy and removal of the cyst wall.  Given his history of lunate edema, there is a question of a possible prior scapholunate interosseous ligament injury. There is a possibility for recurrence of this myxoid cyst given the circumstances.  The wound was then careful exam for hemostasis.  I made the judgment that with the amount of neurodesis were identified as well as tethering of the motor branch due to traction affects from the scarring not to proceed with a opponensplasty at this time.  The wound was then provisionally closed with subcutaneous 4-0 Vicryl.  Attention was then  directed to the medial elbow.  A 3-cm incision was fashioned paralleling the path of the ulnar nerve.  Subcutaneous tissues were carefully divided revealing a large medial head of the triceps covering the nerve.  This was released from the arcuate ligament followed by decompression and neurolysis of the ulnar nerve from 6 cm above the epicondyle, 6 cm distal to the epicondyle.  The fascia at the heads of flexor carpi ulnaris was released and the muscle fibers teased apart.  The articular branches were identified and preserved.  A portion of the triceps muscle medially was removed so that the nerve would be stable through full range of motion 0-140 degrees.  They did appear to be compression of the level the arcuate ligament. Bleeding points were electrocauterized with bipolar current followed by repair of the skin with subcutaneous 4-0 Vicryl and intradermal 3-0 Prolene.  The tourniquet was released and hemostasis was achieved at the wrist level.  We noted that Mr. Layton had a very significant tendency to continue to lose as if he had a qualitative platelet defect.  It is possible that he has a von Willebrand's syndrome or other mild bleeding abnormality which could have accounted for his original postoperative hematoma.  The distal wound was drained with 3 vessel loop drains followed by application of Adaptic sterile gauze, sterile Kerlix, sterile Webril, and a volar plaster splint maintaining the wrist at 20 degrees dorsiflexion, the elbow was dressed with sterile gauze and Tegaderm followed by Ace wrap.  Mr. Daughdrill was placed in a sling and transferred to the recovery room in stable signs.  We will see him back for followup in our office in 48 hours for dressing removal, drain removal, and advancement to a range of motion exercise program to prevent recurrent neurodesis.     Katy Fitch Yeni Jiggetts, M.D.     RVS/MEDQ  D:  08/21/2012  T:  08/22/2012  Job:  [id number]

## 2013-07-09 ENCOUNTER — Encounter: Payer: Self-pay | Admitting: Cardiovascular Disease

## 2013-07-09 ENCOUNTER — Ambulatory Visit (INDEPENDENT_AMBULATORY_CARE_PROVIDER_SITE_OTHER): Payer: Managed Care, Other (non HMO) | Admitting: Cardiovascular Disease

## 2013-07-09 VITALS — BP 126/68 | HR 88 | Resp 16 | Ht 68.0 in | Wt 202.0 lb

## 2013-07-09 DIAGNOSIS — R0600 Dyspnea, unspecified: Secondary | ICD-10-CM | POA: Insufficient documentation

## 2013-07-09 DIAGNOSIS — R0609 Other forms of dyspnea: Secondary | ICD-10-CM

## 2013-07-09 DIAGNOSIS — R0989 Other specified symptoms and signs involving the circulatory and respiratory systems: Secondary | ICD-10-CM | POA: Insufficient documentation

## 2013-07-09 NOTE — Patient Instructions (Signed)
Your physician has requested that you have an echocardiogram. Echocardiography is a painless test that uses sound waves to create images of your heart. It provides your doctor with information about the size and shape of your heart and how well your heart's chambers and valves are working. This procedure takes approximately one hour. There are no restrictions for this procedure.    Your physician recommends that you schedule a follow-up appointment in: 1 month  

## 2013-07-09 NOTE — Progress Notes (Signed)
Patient ID: James Munoz, male   DOB: [birth year], 52 y.o.   MRN: [id number]      Reason for office visit Dyspnea  Self-described as previously "very active and strong and healthy", Mr. Hazen has had progressively worsening exertional and positional dyspnea for 2-3 months. His symptoms are exertional (walking from parking lot to building at work), positional (worse if he lies down on his back) and related to meals (lying down after a meal is particularly distressing. Unable to get relaxed and comfortable when he lies in bed to sleep. Has stopped sleeping on his back, but relatively comfortable lying on his right side. Used to snore heavily, but this has stopped since he no longer sleeps on his back. 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 Even talking is curtailed ("and I'm a big talker"). Repeatedly describes an inability to take a deep breath.  He has not had fever or chills, cough or hemoptysis wheezing, edema or any chest pain, either pleuritic or anginal. Inhaled steroids did not help. His weight has actually declined slightly over the last several months.  He does not have any conventional cardiovascular risk factors. He briefly smoked (< 10 pack-years) and quit > 30 years ago.  His BNP, ECG and chest Xray are normal. Dr. Waynard Edwards requested a CTA of the chest, but I think insurance refuse to cover it. A D-dimer was reportedly OK.   No Known Allergies  Current Outpatient Prescriptions  Medication Sig Dispense Refill  . Acetaminophen (TYLENOL ARTHRITIS EXT RELIEF PO) Take 2 tablets by mouth daily as needed. Patient used this medication for his headache.      Marland Kitchen acetaminophen (TYLENOL) 325 MG tablet Take 650 mg by mouth every 6 (six) hours as needed.      . Aspirin-Acetaminophen-Caffeine (EXCEDRIN PO) Take 1 tablet by mouth as needed.      Marland Kitchen ibuprofen (ADVIL,MOTRIN) 200 MG tablet Take 400 mg by mouth every 6 (six) hours as  needed. Patient uses this medication for pain.      . Multiple Vitamin (MULTIVITAMIN) tablet Take 1 tablet by mouth daily.       No current facility-administered medications for this visit.    Past Medical History  Diagnosis Date  . Dental bridge present     lower  . Snores     denies apnea or waking up gasping/choking  . Carpal tunnel syndrome of right wrist 03/2012    weakness and decreased motor skills right hand    Past Surgical History  Procedure Laterality Date  . Appendectomy    . Carpal tunnel release  04/06/2012    Procedure: CARPAL TUNNEL RELEASE;  Surgeon: Wyn Forster., MD;  Location: [facility];  Service: Orthopedics;  Laterality: Right;  . Tendon exploration  08/21/2012    Procedure: TENDON EXPLORATION;  Surgeon: Wyn Forster., MD;  Location: [facility];  Service: Orthopedics;  Laterality: Right;  Wide Exploration of Carpal Canal , Cyst Excision Right    . Ulnar nerve transposition  08/21/2012    Procedure: ULNAR NERVE DECOMPRESSION/TRANSPOSITION;  Surgeon: Wyn Forster., MD;  Location: [facility];  Service: Orthopedics;  Laterality: Right;  Decompression Ulnar Nerve      No family history on file.  History   Social History  . Marital Status: Married    Spouse Name: N/A    Number of Children: N/A  . Years of Education: N/A  Occupational History  . Not on file.   Social History Main Topics  . Smoking status: Never Smoker   . Smokeless tobacco: Never Used  . Alcohol Use: No  . Drug Use: No  . Sexual Activity:    Other Topics Concern  . Not on file   Social History Narrative  . No narrative on file    Review of systems: The patient specifically denies any chest pain at rest or with exertion, syncope, palpitations, focal neurological deficits, intermittent claudication, lower extremity edema, unexplained weight gain, cough, hemoptysis or wheezing.  The patient also denies abdominal pain,  nausea, vomiting, dysphagia, diarrhea, constipation, polyuria, polydipsia, dysuria, hematuria, frequency, urgency, abnormal bleeding or bruising, fever, chills, unexpected weight changes, mood swings, change in skin or hair texture, change in voice quality, auditory or visual problems, allergic reactions or rashes, new musculoskeletal complaints other than usual "aches and pains".   PHYSICAL EXAM BP 126/68  Pulse 88  Resp 16  Ht 5\' 8"  (1.727 m)  Wt 202 lb (91.627 kg)  BMI 30.72 kg/m2  General: Alert, oriented x3, no distress Head: no evidence of trauma, PERRL, EOMI, no exophtalmos or lid lag, no myxedema, no xanthelasma; normal ears, nose and oropharynx Neck: normal jugular venous pulsations and no hepatojugular reflux; brisk carotid pulses without delay and no carotid bruits Chest: clear to auscultation, no signs of consolidation by percussion or palpation, normal fremitus, symmetrical and full respiratory excursions Cardiovascular: normal position and quality of the apical impulse, regular rhythm, normal first and second heart sounds, no murmurs, rubs or gallops Abdomen: protuberant due to obesity, but no tenderness or distention, no masses by palpation, no abnormal pulsatility or arterial bruits, normal bowel sounds, no hepatosplenomegaly Extremities: no clubbing, cyanosis or edema; 2+ radial, ulnar and brachial pulses bilaterally; 2+ right femoral, posterior tibial and dorsalis pedis pulses; 2+ left femoral, posterior tibial and dorsalis pedis pulses; no subclavian or femoral bruits Neurological: grossly nonfocal   EKG: Normal, SR  PEAK FLOW 325 mL/min  BMET Creat 0.8, BNP 4.9   ASSESSMENT AND PLAN Dyspnea Symptoms are rather atypical. Initially seemed to describe orthopnea, but it sounds like lying on back is biggest problem, rather than just horizontal position. Exam is normal, as is CXR and BNP. No wheezing and no benefit from inhaled steroids. Some features suggest sleep  apnea, others suggest a GI component. There appears to be an emotional aspect to his symptoms as well. Recommend an echocardiogram, but anticipate this may be a normal study. A sleep study has been ordered. Full PFTs would be useful. If all these noninvasive studies are unrevealing, consider a right and left heart catheterization as the definitive way to discriminate cardiac versus pulmonary etiology.   Orders Placed This Encounter  Procedures  . 2D Echocardiogram without contrast  . 2D Echocardiogram with contrast   Meds ordered this encounter  Medications  . Aspirin-Acetaminophen-Caffeine (EXCEDRIN PO)    Sig: Take 1 tablet by mouth as needed.    Junious Silk, MD, [facility] [phone] office [phone] pager

## 2013-07-09 NOTE — Assessment & Plan Note (Addendum)
Symptoms are rather atypical. Initially seemed to describe orthopnea, but it sounds like lying on back is biggest problem, rather than just horizontal position. Exam is normal, as is CXR and BNP. No wheezing and no benefit from inhaled steroids. Some features suggest sleep apnea, others suggest a GI component. There appears to be an emotional aspect to his symptoms as well. Recommend an echocardiogram, but anticipate this may be a normal study. A sleep study has been ordered. Full PFTs would be useful. If all these noninvasive studies are unrevealing, consider a right and left heart catheterization as the definitive way to discriminate cardiac versus pulmonary etiology.

## 2013-07-12 ENCOUNTER — Encounter: Payer: Self-pay | Admitting: Neurology

## 2013-07-12 ENCOUNTER — Ambulatory Visit (INDEPENDENT_AMBULATORY_CARE_PROVIDER_SITE_OTHER): Payer: Managed Care, Other (non HMO) | Admitting: Neurology

## 2013-07-12 VITALS — BP 130/78 | HR 88 | Resp 16 | Ht 69.0 in | Wt 202.0 lb

## 2013-07-12 DIAGNOSIS — R0683 Snoring: Secondary | ICD-10-CM | POA: Insufficient documentation

## 2013-07-12 DIAGNOSIS — R0609 Other forms of dyspnea: Secondary | ICD-10-CM

## 2013-07-12 DIAGNOSIS — F519 Sleep disorder not due to a substance or known physiological condition, unspecified: Secondary | ICD-10-CM | POA: Insufficient documentation

## 2013-07-12 DIAGNOSIS — G4700 Insomnia, unspecified: Secondary | ICD-10-CM

## 2013-07-12 DIAGNOSIS — M62838 Other muscle spasm: Secondary | ICD-10-CM

## 2013-07-12 DIAGNOSIS — R0602 Shortness of breath: Secondary | ICD-10-CM

## 2013-07-12 DIAGNOSIS — F5109 Other insomnia not due to a substance or known physiological condition: Secondary | ICD-10-CM | POA: Insufficient documentation

## 2013-07-12 DIAGNOSIS — M542 Cervicalgia: Secondary | ICD-10-CM | POA: Insufficient documentation

## 2013-07-12 DIAGNOSIS — G542 Cervical root disorders, not elsewhere classified: Secondary | ICD-10-CM

## 2013-07-12 DIAGNOSIS — IMO0001 Reserved for inherently not codable concepts without codable children: Secondary | ICD-10-CM | POA: Insufficient documentation

## 2013-07-12 DIAGNOSIS — M531 Cervicobrachial syndrome: Secondary | ICD-10-CM

## 2013-07-12 HISTORY — DX: Cervical root disorders, not elsewhere classified: G54.2

## 2013-07-12 HISTORY — DX: Snoring: R06.83

## 2013-07-12 NOTE — Addendum Note (Signed)
Addended by: Melvyn Novas on: 07/12/2013 11:21 AM   Modules accepted: Orders

## 2013-07-12 NOTE — Patient Instructions (Signed)
Sleep Apnea  Sleep apnea is a sleep disorder characterized by abnormal pauses in breathing while you sleep. When your breathing pauses, the level of oxygen in your blood decreases. This causes you to move out of deep sleep and into light sleep. As a result, your quality of sleep is poor, and the system that carries your blood throughout your body (cardiovascular system) experiences stress. If sleep apnea remains untreated, the following conditions can develop:  High blood pressure (hypertension).  Coronary artery disease.  Inability to achieve or maintain an erection (impotence).  Impairment of your thought process (cognitive dysfunction). There are three types of sleep apnea: 1. Obstructive sleep apnea Pauses in breathing during sleep because of a blocked airway. 2. Central sleep apnea Pauses in breathing during sleep because the area of the brain that controls your breathing does not send the correct signals to the muscles that control breathing. 3. Mixed sleep apnea A combination of both obstructive and central sleep apnea. RISK FACTORS The following risk factors can increase your risk of developing sleep apnea:  Being overweight.  Smoking.  Having narrow passages in your nose and throat.  Being of older age.  Being male.  Alcohol use.  Sedative and tranquilizer use.  Ethnicity. Among individuals younger than 35 years, African Americans are at increased risk of sleep apnea. SYMPTOMS   Difficulty staying asleep.  Daytime sleepiness and fatigue.  Loss of energy.  Irritability.  Loud, heavy snoring.  Morning headaches.  Trouble concentrating.  Forgetfulness.  Decreased interest in sex. DIAGNOSIS  In order to diagnose sleep apnea, your caregiver will perform a physical examination. Your caregiver may suggest that you take a home sleep test. Your caregiver may also recommend that you spend the night in a sleep lab. In the sleep lab, several monitors record  information about your heart, lungs, and brain while you sleep. Your leg and arm movements and blood oxygen level are also recorded. TREATMENT The following actions may help to resolve mild sleep apnea:  Sleeping on your side.   Using a decongestant if you have nasal congestion.   Avoiding the use of depressants, including alcohol, sedatives, and narcotics.   Losing weight and modifying your diet if you are overweight. There also are devices and treatments to help open your airway:  Oral appliances. These are custom-made mouthpieces that shift your lower jaw forward and slightly open your bite. This opens your airway.  Devices that create positive airway pressure. This positive pressure "splints" your airway open to help you breathe better during sleep. The following devices create positive airway pressure:  Continuous positive airway pressure (CPAP) device. The CPAP device creates a continuous level of air pressure with an air pump. The air is delivered to your airway through a mask while you sleep. This continuous pressure keeps your airway open.  Nasal expiratory positive airway pressure (EPAP) device. The EPAP device creates positive air pressure as you exhale. The device consists of single-use valves, which are inserted into each nostril and held in place by adhesive. The valves create very little resistance when you inhale but create much more resistance when you exhale. That increased resistance creates the positive airway pressure. This positive pressure while you exhale keeps your airway open, making it easier to breath when you inhale again.  Bilevel positive airway pressure (BPAP) device. The BPAP device is used mainly in patients with central sleep apnea. This device is similar to the CPAP device because it also uses an air pump to deliver  continuous air pressure through a mask. However, with the BPAP machine, the pressure is set at two different levels. The pressure when you  exhale is lower than the pressure when you inhale.  Surgery. Typically, surgery is only done if you cannot comply with less invasive treatments or if the less invasive treatments do not improve your condition. Surgery involves removing excess tissue in your airway to create a wider passage way. Document Released: 08/26/2002 Document Revised: 03/06/2012 Document Reviewed: 01/12/2012 [facility] Patient Information 2014 [place], [place]. Shortness of Breath Shortness of breath means you have trouble breathing. Shortness of breath needs medical care right away. HOME CARE   Do not smoke.  Avoid being around chemicals or things (paint fumes, dust) that may bother your breathing.  Rest as needed. Slowly begin your normal activities.  Only take medicines as told by your doctor.  Keep all doctor visits as told. GET HELP RIGHT AWAY IF:   Your shortness of breath gets worse.  You feel lightheaded, pass out (faint), or have a cough that is not helped by medicine.  You cough up blood.  You have pain with breathing.  You have pain in your chest, arms, shoulders, or belly (abdomen).  You have a fever.  You cannot walk up stairs or exercise the way you normally do.  You do not get better in the time expected.  You have a hard time doing normal activities even with rest.  You have problems with your medicines.  You have any new symptoms. MAKE SURE YOU:  Understand these instructions.  Will watch your condition.  Will get help right away if you are not doing well or get worse. Document Released: 02/22/2008 Document Revised: 03/06/2012 Document Reviewed: 11/21/2011 [facility] Patient Information 2014 [place], [place].

## 2013-07-12 NOTE — Progress Notes (Addendum)
[facility]EP [facility]  Provider:  Melvyn Novas, M D  Referring Provider: Ezequiel Kayser, MD Primary Care Physician:  Ezequiel Kayser, MD  Chief Complaint  Patient presents with  . sleep consult    snoring    HPI:  James Munoz is a 52 y.o. male  Is seen here as a referral  from Dr. Waynard Edwards for a sleep consultation.   James Munoz reports that over the last couple months or so he has developed shortness of breath a very new symptom for him. He reports that when he clocks out of work and walks to [employer] walk along resulting hasty causes shortness of breath. He cannot sleep on this back any longer so he prefers to sleep on his right side. He feels that supine sleep does not allow him to breathe well. He has also noted over the last couple of months am decreasing levels of energy he is very fatigued and excessively daytime sleepy. The patient considers himself physically always very active, but recently he was put off by his being short of breath by playing  Disc-golf. This restricted as tolerance to exercise to a degree he has not encounter before. He also states he can fall asleep " at the drop of a hat", before I came into the room he confessed he already took a nap. He likes to take evening walks, but need to rest afterwards.    His nocturnal sleep is characterized by some routines, 9.00 Pm  is his bedtime and his sleep time , he wakes up at 1 AM  and again at 3 AM and 4 .30 . He only estimates his nocturnal sleep time of sound sleep between  3-4 hours. He has trouble to stay asleep, which proceeds his recent development of SOB by 8 month. He wakes up spontaneously and goes to the bathroom 3 times , but it is not the bathroom break that wakes him. He has trouble to sit in a recliner, feels uncomfortable restricted when reclined.  He sleeps on his side , because he snored when on his back. He chokes when supine. His wife  Prodded him several times at night before  he assumed the lateral sleep position.  His cardiologist had tested him. Echo normal, EKG normal. CX ray and  PFT in Dr Perini's office normal, Blood tests normal.   All these physician asked him to persue a sleep test. He wakes up with a dry mouth , and headaches.   The patient endorsed the epworth sleepiness score at 21 points, and FSS at 62 points, both very high. He is less productive at work, he started a new job 6 month ago and at that time was not affected. All symptoms started at the time of the new employment. He works not around vapors or dust.  He makes Sport and exercise psychologist.    Important : The patient is a Medical laboratory scientific officer , and has not enough wind to produce the sound.   He has  Had well toned, strong respiratory muscles and a normal upper airway. Last years carpal tunnel surgery by Dr. Teressa Senter still left him with numbness in the left hand,      Review of Systems: Out of a complete 14 system review, the patient complains of only the following symptoms, and all other reviewed systems are negative. SOB, fatigue, snoring, EDS . Severe. Leg cramps, arm cramps , when turning neck.   History   Social History  . Marital  Status: Married    Spouse Name: debbie    Number of Children: N/A  . Years of Education: N/A   Occupational History  . line assembler    Social History Main Topics  . Smoking status: Former Smoker    Types: Cigarettes  . Smokeless tobacco: Never Used     Comment: Pt quit in 1980  . Alcohol Use: No  . Drug Use: No  . Sexual Activity: Not on file   Other Topics Concern  . Not on file   Social History Narrative   Lives w James Munoz works as a Herbalist          History reviewed. No pertinent family history.  Past Medical History  Diagnosis Date  . Dental bridge present     lower  . Snores     denies apnea or waking up gasping/choking  . Carpal tunnel syndrome of right wrist 03/2012    weakness and decreased motor skills right hand     Past Surgical History  Procedure Laterality Date  . Appendectomy    . Carpal tunnel release  04/06/2012    Procedure: CARPAL TUNNEL RELEASE;  Surgeon: Wyn Forster., MD;  Location: [facility];  Service: Orthopedics;  Laterality: Right;  . Tendon exploration  08/21/2012    Procedure: TENDON EXPLORATION;  Surgeon: Wyn Forster., MD;  Location: [facility];  Service: Orthopedics;  Laterality: Right;  Wide Exploration of Carpal Canal , Cyst Excision Right    . Ulnar nerve transposition  08/21/2012    Procedure: ULNAR NERVE DECOMPRESSION/TRANSPOSITION;  Surgeon: Wyn Forster., MD;  Location: [facility];  Service: Orthopedics;  Laterality: Right;  Decompression Ulnar Nerve      Current Outpatient Prescriptions  Medication Sig Dispense Refill  . Acetaminophen (TYLENOL ARTHRITIS EXT RELIEF PO) Take 2 tablets by mouth daily as needed. Patient used this medication for his headache.      . Multiple Vitamin (MULTIVITAMIN) tablet Take 1 tablet by mouth daily.      Marland Kitchen acetaminophen (TYLENOL) 325 MG tablet Take 650 mg by mouth every 6 (six) hours as needed.      . Aspirin-Acetaminophen-Caffeine (EXCEDRIN PO) Take 1 tablet by mouth as needed.      Marland Kitchen ibuprofen (ADVIL,MOTRIN) 200 MG tablet Take 400 mg by mouth every 6 (six) hours as needed. Patient uses this medication for pain.       No current facility-administered medications for this visit.    Allergies as of 07/12/2013  . (No Known Allergies)    Vitals: BP 130/78  Pulse 88  Resp 16  Ht 5\' 9"  (1.753 m)  Wt 202 lb (91.627 kg)  BMI 29.82 kg/m2 Last Weight:  Wt Readings from Last 1 Encounters:  07/12/13 202 lb (91.627 kg)   Last Height:   Ht Readings from Last 1 Encounters:  07/12/13 5\' 9"  (1.753 m)    Physical exam:  General: The patient is awake, alert and appears not in acute distress. The patient is well groomed. Head: Normocephalic, atraumatic. Neck is supple.  Mallampati 4,  Lateral narrowing, neck circumference:15,  Cardiovascular:  Regular rate and rhythm  without  murmurs or carotid bruit, and without distended neck veins. Respiratory: Lungs are clear to auscultation. Skin:  Without evidence of edema, or rash Trunk: BMI is normal,  posture.  Neurologic exam : The patient is awake and alert, oriented to place and time.  Memory subjective  described as intact.  There is a normal attention span & concentration ability.  Speech is fluent without  dysarthria, dysphonia or aphasia.  Mood and affect are appropriate.  Cranial nerves: Pupils are equal and briskly reactive to light. Funduscopic exam without  evidence of pallor or edema. Extraocular movements  in vertical and horizontal planes intact and without nystagmus. Visual fields by finger perimetry are intact. Hearing to finger rub intact.  Facial sensation intact to fine touch. Facial motor strength is symmetric and tongue and uvula move midline.  Motor exam:     Reduced  tone , no rigor or cog wheeling, almost floppy -and normal muscle bulk  ,  l right dominant hand reduced  grip strength, left sided  atrophy of the thenar eminence. Decreased Pinch strength.  ROM for wrist and ankle . extremities.  Sensory:  Fine touch, pinprick and vibration were tested in all extremities. Proprioception is normal.  Coordination: Rapid alternating movements in the fingers/hands is tested and normal. Finger-to-nose maneuver tested and normal without evidence of ataxia, dysmetria or tremor.  Gait and station: Patient walks without assistive device and is able and assisted stool climb up to the exam table. Strength within normal limits.  Stance is stable and normal. Tandem gait is fragmented. Romberg testing is normal.  normal.  Deep tendon reflexes: in the  upper and lower extremities are symmetric and intact. Babinski maneuver response is  downgoing.   Assessment:  After physical and neurologic examination,  review of  Imaging, and pre-existing records, assessment is that of unexplained SOB with change in exercise tolerance and inability to play his trumpet- diaphragmatic weakness.? He has apnea , by witnessed events and choking episodes he himself experienced.  Steroid inhaler did not help, cardiac work up was negative . CXR negative.   Possible cervical compression? Neck pain radiation to both hands with rotation movement of either upper extremity. Myasthenia  Versus cervical cord abnormalities causing him to breath shallow. He has stiffness in  all 4 extremities and frequent spasms , but not hyperreflexia.     Plan:  Treatment plan and additional workup :I will order a fluoroscopic breathing study and the desired SPLIT PSG. In addition , electrolyte  panel, CK CKMB and Ach receptor antibody .Cervical MRI,

## 2013-07-13 ENCOUNTER — Telehealth: Payer: Self-pay | Admitting: Diagnostic Neuroimaging

## 2013-07-13 NOTE — Telephone Encounter (Signed)
Called by [facility] about abnl labs CK 1241. CKMB 24.4. Noted and will send to Dr. Vickey Huger for review.   -VRP

## 2013-07-15 NOTE — Telephone Encounter (Signed)
Patient cannot take calls at the job . I left him a message about highly elevated muscle enzymes, indicating a myositis. Treatment is high dose steroids. He may want to get at home iv therapy or in  office. Oral prednione may just not be enough to help his symptoms. May work later after pulse therapy.

## 2013-07-16 ENCOUNTER — Telehealth: Payer: Self-pay | Admitting: Diagnostic Neuroimaging

## 2013-07-16 ENCOUNTER — Ambulatory Visit (INDEPENDENT_AMBULATORY_CARE_PROVIDER_SITE_OTHER): Payer: Managed Care, Other (non HMO) | Admitting: Neurology

## 2013-07-16 ENCOUNTER — Ambulatory Visit: Payer: Self-pay

## 2013-07-16 ENCOUNTER — Telehealth: Payer: Self-pay | Admitting: Neurology

## 2013-07-16 DIAGNOSIS — J99 Respiratory disorders in diseases classified elsewhere: Secondary | ICD-10-CM

## 2013-07-16 DIAGNOSIS — M3321 Polymyositis with respiratory involvement: Secondary | ICD-10-CM

## 2013-07-16 DIAGNOSIS — M3320 Polymyositis, organ involvement unspecified: Secondary | ICD-10-CM

## 2013-07-16 MED ORDER — SODIUM CHLORIDE 0.9 % IV SOLN
500.0000 mg | INTRAVENOUS | Status: DC
  Administered 2013-07-16 – 2013-07-17 (×2): 500 mg via INTRAVENOUS

## 2013-07-16 MED ORDER — SODIUM CHLORIDE 0.9 % IV SOLN: 500.0000 mg | Freq: Once | INTRAVENOUS | Status: DC

## 2013-07-16 MED ORDER — PREDNISONE 10 MG PO TABS: 10.0000 mg | ORAL_TABLET | Freq: Every day | ORAL | Status: DC

## 2013-07-16 NOTE — Progress Notes (Signed)
Patient here for Solumedrol infusion.  Order for Solumedrol 500mg  IV.  Patient to treatment room with wife.  IV attempted in right AC unsuccessful, 2nd attempt in left hand, good blood return, 22g angiocath.  Solumedrol 500mg /100cc started at 1535.  Upon completion IV discontinued and removed.  Patient to checkout in no NAD.

## 2013-07-16 NOTE — Telephone Encounter (Signed)
The sleep study will still be needed, may be oxygen will help him until than overnight.  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  Most patients respond within  14 days , than oral taper.

## 2013-07-16 NOTE — Telephone Encounter (Signed)
High CK levels suspicious for polymyositis ,  Initiate treatment with high dose IV steroids today and tomorrow after patient returns from work, about 3 PM. Patient has spoken to Dr. Waynard Edwards, who is aware of the treatment and possible complications.  Iv treatment is to be  followed by an oral taper- beginning at 80 mg prednisone per day in AM.   Anti Jo 1 ab to be drawn here today .  CK repeat entered as future order - will need to follow that level to see of therapy is improving muscle health. Marland Kitchen

## 2013-07-16 NOTE — Telephone Encounter (Signed)
Wife said that they want more clarification on diagnosis, what to look for going forward, does patient still need to have sleep study done, how long will he have to take the IV therapy.please call

## 2013-07-16 NOTE — Patient Instructions (Signed)
Patient will return tomorrow at 0830 for second dose of Solumedrol.

## 2013-07-16 NOTE — Telephone Encounter (Signed)
Patient coming today at 3pm for IV solumedrol.

## 2013-07-17 ENCOUNTER — Other Ambulatory Visit (INDEPENDENT_AMBULATORY_CARE_PROVIDER_SITE_OTHER): Payer: Managed Care, Other (non HMO)

## 2013-07-17 ENCOUNTER — Ambulatory Visit (INDEPENDENT_AMBULATORY_CARE_PROVIDER_SITE_OTHER): Payer: Managed Care, Other (non HMO) | Admitting: Neurology

## 2013-07-17 VITALS — BP 115/76 | HR 78 | Temp 97.7°F

## 2013-07-17 DIAGNOSIS — Z0289 Encounter for other administrative examinations: Secondary | ICD-10-CM

## 2013-07-17 DIAGNOSIS — M3320 Polymyositis, organ involvement unspecified: Secondary | ICD-10-CM

## 2013-07-17 DIAGNOSIS — J99 Respiratory disorders in diseases classified elsewhere: Secondary | ICD-10-CM

## 2013-07-17 DIAGNOSIS — M3321 Polymyositis with respiratory involvement: Secondary | ICD-10-CM

## 2013-07-17 NOTE — Addendum Note (Signed)
Addended by: Mickey Farber T on: 07/17/2013 10:00 AM   Modules accepted: Orders

## 2013-07-17 NOTE — Progress Notes (Signed)
Patient here for day 2/2 Solumedrol 500mg  IV.  Patient to the treatment room.  Patient had blood drawn for lab tests, tried to use butterfly for infusion.  It worked for a short time but had to be discontinued.  IV attempted in right hand unsuccessful, 2nd attempt in right wrist, good blood return, 22g angiocath.  Solumedrol 500mg /100cc started at 0920.  Upon completion IV discontinued and disconnected.  Patient to checkout in NAD.

## 2013-07-17 NOTE — Patient Instructions (Signed)
Patient will start oral prednisone tomorrow.

## 2013-07-18 ENCOUNTER — Telehealth: Payer: Self-pay | Admitting: Neurology

## 2013-07-19 ENCOUNTER — Telehealth: Payer: Self-pay | Admitting: *Deleted

## 2013-07-19 MED ORDER — PROMETHAZINE HCL 25 MG PO TABS: 25.0000 mg | ORAL_TABLET | Freq: Every evening | ORAL | Status: DC | PRN

## 2013-07-19 NOTE — Telephone Encounter (Signed)
Dr. Vickey Huger called me and wanted to place a phenergan order for pt for 25mg  po qhs prn n/v # 30 with one refill.

## 2013-07-19 NOTE — Telephone Encounter (Signed)
Called and left VM message for the questions that patient has for the doctor concerning prednisone

## 2013-07-22 ENCOUNTER — Other Ambulatory Visit: Payer: Self-pay | Admitting: Neurology

## 2013-07-22 DIAGNOSIS — M3320 Polymyositis, organ involvement unspecified: Secondary | ICD-10-CM

## 2013-07-22 DIAGNOSIS — J99 Respiratory disorders in diseases classified elsewhere: Secondary | ICD-10-CM

## 2013-07-22 NOTE — Telephone Encounter (Signed)
Left message for patient to see if prednisone questions were answered.  Also told him we put in an order for Diaphragm Fluoroscopy and he will get a call from [facility] imaging to schedule.

## 2013-07-22 NOTE — Telephone Encounter (Signed)
Lupita Leash, I y think you spoke to patient  Later on Friday PM- Ammie send this to me after I was not longer at  the office. He needed phenergan.

## 2013-07-23 NOTE — Telephone Encounter (Signed)
Patient is tolerating the oral steroids less well than iv solumedrol.  Currently on 80 mg daily prednisone . Anti Jo ab ( negative )  and CK for steroid response  obtained.  Added phenergan for nausea.  Patient will change to 70 mg daily this  week Wednesday , and to 60 mg daily the next week- if CK decreases.

## 2013-07-24 ENCOUNTER — Ambulatory Visit (HOSPITAL_COMMUNITY)
Admission: RE | Admit: 2013-07-24 | Discharge: 2013-07-24 | Disposition: A | Discharge status: Home / Self Care | Payer: Managed Care, Other (non HMO) | Source: Ambulatory Visit | Attending: Cardiovascular Disease | Admitting: Cardiovascular Disease

## 2013-07-24 DIAGNOSIS — R0989 Other specified symptoms and signs involving the circulatory and respiratory systems: Secondary | ICD-10-CM | POA: Insufficient documentation

## 2013-07-24 DIAGNOSIS — R0602 Shortness of breath: Secondary | ICD-10-CM

## 2013-07-24 DIAGNOSIS — R0609 Other forms of dyspnea: Secondary | ICD-10-CM | POA: Insufficient documentation

## 2013-07-24 DIAGNOSIS — R0600 Dyspnea, unspecified: Secondary | ICD-10-CM

## 2013-07-24 LAB — ELECTROLYTE PANEL: Sodium: 143 mmol/L (ref 134–144)

## 2013-07-24 LAB — ACETYLCHOLINE RECEPTOR, BINDING: AChR Binding Ab, Serum: <0.03 nmol/L (ref 0.00–0.24)

## 2013-07-24 NOTE — Progress Notes (Signed)
2D Echo Performed 10/03/2012    Oziah Vitanza, RCS  

## 2013-07-28 ENCOUNTER — Telehealth: Payer: Self-pay | Admitting: Neurology

## 2013-07-29 ENCOUNTER — Telehealth: Payer: Self-pay | Admitting: Neurology

## 2013-07-30 NOTE — Telephone Encounter (Signed)
Spoke to patient's wife and she wanted to know what the test was for, and then proceeded to say that their insurance will not pay for the test.  They don't want it done if insurance won't cover the test.  The patient has an appointment with the rheumatologist on 08-06-13.  They would like to know the next step.   [phone]

## 2013-08-01 NOTE — Telephone Encounter (Signed)
Please confirm order for  Diaphragm fluoroscopy was send or not ? Marland Kitchen

## 2013-08-01 NOTE — Telephone Encounter (Signed)
Please see phone note from 07-29-13.

## 2013-08-05 ENCOUNTER — Telehealth: Payer: Self-pay | Admitting: Neurology

## 2013-08-05 NOTE — Telephone Encounter (Signed)
Spoke to patient's wife and relayed that the doctor said it was ok not to get the diaphragm fluoroscopy but that she wanted to see the patient after his rheumatology visit.  Patient will call back to confirm appointment date of 08-13-13.

## 2013-08-06 ENCOUNTER — Other Ambulatory Visit (HOSPITAL_COMMUNITY): Payer: Self-pay | Admitting: Rheumatology

## 2013-08-06 DIAGNOSIS — R0600 Dyspnea, unspecified: Secondary | ICD-10-CM

## 2013-08-06 LAB — PULMONARY FUNCTION TEST
DL/VA: 6.59 ml/min/mmHg/L
DLCO unc % pred: 90 %
DLCO unc: 28.1 ml/min/mmHg
FEF 25-75 Post: 2.22 L/sec
FEF2575-%Pred-Pre: 43 %
FEV6-%Change-Post: 9 %
FEV6-%Pred-Pre: 65 %
FEV6-Post: 3.33 L
FEV6FVC-%Change-Post: 0 %
FEV6FVC-%Pred-Post: 103 %
FVC-%Change-Post: 10 %
FVC-%Pred-Post: 69 %
FVC-%Pred-Pre: 63 %
FVC-Pre: 3.04 L
Post FEV6/FVC ratio: 99 %
Pre FEV1/FVC ratio: 70 %
Pre FEV6/FVC Ratio: 100 %
RV % pred: 100 %
TLC % pred: 76 %

## 2013-08-07 ENCOUNTER — Other Ambulatory Visit: Payer: Self-pay | Admitting: Rheumatology

## 2013-08-07 DIAGNOSIS — R0600 Dyspnea, unspecified: Secondary | ICD-10-CM

## 2013-08-08 NOTE — Telephone Encounter (Signed)
i need to see him - please make a late afternoon appointment for this patient , with my permission on Tuesday next - 4.30 PM - will need Tomeko to over ride the mask.

## 2013-08-09 ENCOUNTER — Ambulatory Visit
Admission: RE | Admit: 2013-08-09 | Discharge: 2013-08-09 | Disposition: A | Discharge status: Home / Self Care | Payer: Managed Care, Other (non HMO) | Source: Ambulatory Visit | Attending: Rheumatology | Admitting: Rheumatology

## 2013-08-09 DIAGNOSIS — R0600 Dyspnea, unspecified: Secondary | ICD-10-CM

## 2013-08-09 MED ORDER — IOHEXOL 300 MG/ML  SOLN
75.0000 mL | Freq: Once | INTRAMUSCULAR | Status: AC | PRN
  Administered 2013-08-09: 75 mL via INTRAVENOUS

## 2013-08-12 ENCOUNTER — Ambulatory Visit (HOSPITAL_COMMUNITY)
Admission: RE | Admit: 2013-08-12 | Discharge: 2013-08-12 | Disposition: A | Discharge status: Home / Self Care | Payer: Managed Care, Other (non HMO) | Source: Ambulatory Visit | Attending: Rheumatology | Admitting: Rheumatology

## 2013-08-12 ENCOUNTER — Ambulatory Visit: Payer: Self-pay | Admitting: Neurology

## 2013-08-12 DIAGNOSIS — R0609 Other forms of dyspnea: Secondary | ICD-10-CM | POA: Insufficient documentation

## 2013-08-12 DIAGNOSIS — R0989 Other specified symptoms and signs involving the circulatory and respiratory systems: Secondary | ICD-10-CM | POA: Insufficient documentation

## 2013-08-12 MED ORDER — ALBUTEROL SULFATE (5 MG/ML) 0.5% IN NEBU
2.5000 mg | INHALATION_SOLUTION | Freq: Once | RESPIRATORY_TRACT | Status: AC
  Administered 2013-08-12: 2.5 mg via RESPIRATORY_TRACT

## 2013-08-12 NOTE — Progress Notes (Signed)
Quick Note:  Left message for patient that doctor wants to check is CK levels every month. Please see phone note dated 08-05-13. ______

## 2013-08-12 NOTE — Telephone Encounter (Signed)
I spoke with wife and she stated that they went to the Rheumatologist and he felt that the patient did not have polymyositis nor any kind of auto immune disease. I asked her for the name of the rheumatologist to get the office notes, but she said the patient wasn't comfortable passing that information along.  I do see from the chart that it's Dr. Lurena Nida.  They will be getting his lab work through his PCP and continue follow up with him and rheumatologist.  They will not be making any appointments with neurology right now.  She expressed that they were just frustrated.

## 2013-08-14 ENCOUNTER — Ambulatory Visit (INDEPENDENT_AMBULATORY_CARE_PROVIDER_SITE_OTHER): Payer: Managed Care, Other (non HMO) | Admitting: Cardiovascular Disease

## 2013-08-14 ENCOUNTER — Encounter: Payer: Self-pay | Admitting: Cardiovascular Disease

## 2013-08-14 VITALS — BP 122/82 | HR 89 | Ht 68.0 in | Wt 203.9 lb

## 2013-08-14 DIAGNOSIS — R942 Abnormal results of pulmonary function studies: Secondary | ICD-10-CM

## 2013-08-14 DIAGNOSIS — J45909 Unspecified asthma, uncomplicated: Secondary | ICD-10-CM

## 2013-08-14 DIAGNOSIS — R748 Abnormal levels of other serum enzymes: Secondary | ICD-10-CM

## 2013-08-14 DIAGNOSIS — R0600 Dyspnea, unspecified: Secondary | ICD-10-CM

## 2013-08-14 DIAGNOSIS — R0609 Other forms of dyspnea: Secondary | ICD-10-CM

## 2013-08-14 MED ORDER — OMEPRAZOLE 40 MG PO CPDR: 40.0000 mg | DELAYED_RELEASE_CAPSULE | Freq: Every day | ORAL | Status: AC

## 2013-08-14 NOTE — Patient Instructions (Signed)
START Omeprazole 40mg  take at bedtime.  This prescription has been sent to your pharmacy.  A referral has been sent to [facility].

## 2013-08-14 NOTE — Telephone Encounter (Signed)
Noted  

## 2013-08-14 NOTE — Telephone Encounter (Signed)
i have spoken with PCP and dr Dareen Piano. Patient has primary dyspnea and some myalgia. His CK levels were elevated to  10 times the normal range, his CKMB was not. He has no heart history , his CT showed no honeycombing, therefore no interstitial Lung disease,  He is still considering Pumonary function testing, forced vital capacity and  I  discussed a  Diaphragmatic study with his rheumatologist  / he has not had new CK drawn, which we have ordered twice for him- will get these with rheumatologist or PCP as well.   I understand he is frustrated ,  but myositis is still the main differential for these CK levels, and he felt better after steroid IV , not so much after 80 mg Po , he will therefor be weaned off his steroids.

## 2013-08-17 DIAGNOSIS — R748 Abnormal levels of other serum enzymes: Secondary | ICD-10-CM | POA: Insufficient documentation

## 2013-08-17 NOTE — Progress Notes (Signed)
Patient ID: James Munoz, male   DOB: 1961/07/04, 52 y.o.   MRN: [id number]      Reason for office visit Dyspnea, followup echo  James Munoz continues to be short of breath. An exact cause of his dyspnea has not been identified. He again complains of the fact that he is unable to play his trumpet. He often becomes short of breath when lying down, has not had evidence of edema or exertional dyspnea. His echocardiogram shows normal LV systolic function, normal diastolic function. There is mild mitral valve prolapse with a minimal amount of mitral insufficiency, hemodynamically not significant. He does not have angina pectoris.  His pulmonary function tests do show evidence of obstruction with an FEV1 that is roughly 50% of normal. The postbronchodilator FEV1 shows an improvement of about 10%. Interestingly he reports that his breathing was greatly improved with a 3-4 hours of followed at the may function test, suggestive of a good clinical response to bronchodilators.  Steroid therapy has not had any beneficial effect on his breathing.   No Known Allergies  Current Outpatient Prescriptions  Medication Sig Dispense Refill  . albuterol (PROVENTIL HFA;VENTOLIN HFA) 108 (90 BASE) MCG/ACT inhaler Inhale into the lungs 2 (two) times daily.      Marland Kitchen ibuprofen (ADVIL,MOTRIN) 200 MG tablet Take 400 mg by mouth every 6 (six) hours as needed. Patient uses this medication for pain.      . Multiple Vitamin (MULTIVITAMIN) tablet Take 1 tablet by mouth daily.      . predniSONE (DELTASONE) 10 MG tablet Take 40 mg by mouth daily.      . Probiotic Product (PROBIOTIC DAILY PO) Take by mouth daily.      Marland Kitchen acetaminophen (TYLENOL) 325 MG tablet Take 650 mg by mouth every 6 (six) hours as needed.      Marland Kitchen omeprazole (PRILOSEC) 40 MG capsule Take 1 capsule (40 mg total) by mouth at bedtime.  30 capsule  5   Current Facility-Administered Medications  Medication Dose Route Frequency Provider Last Rate Last Dose  .  methylPREDNISolone sodium succinate (SOLU-MEDROL) 500 mg in sodium chloride 0.9 % 100 mL IVPB  500 mg Intravenous Continuous Melvyn Novas, MD   500 mg at 07/17/13 0920    Past Medical History  Diagnosis Date  . Dental bridge present     lower  . Snores     denies apnea or waking up gasping/choking  . Carpal tunnel syndrome of right wrist 03/2012    weakness and decreased motor skills right hand  . Snoring disorder 07/12/2013  . Cervical spine syndrome 07/12/2013    Past Surgical History  Procedure Laterality Date  . Appendectomy    . Carpal tunnel release  04/06/2012    Procedure: CARPAL TUNNEL RELEASE;  Surgeon: Wyn Forster., MD;  Location: [facility];  Service: Orthopedics;  Laterality: Right;  . Tendon exploration  08/21/2012    Procedure: TENDON EXPLORATION;  Surgeon: Wyn Forster., MD;  Location: [facility];  Service: Orthopedics;  Laterality: Right;  Wide Exploration of Carpal Canal , Cyst Excision Right    . Ulnar nerve transposition  08/21/2012    Procedure: ULNAR NERVE DECOMPRESSION/TRANSPOSITION;  Surgeon: Wyn Forster., MD;  Location: [facility];  Service: Orthopedics;  Laterality: Right;  Decompression Ulnar Nerve      No family history on file.  History   Social History  . Marital Status: Married    Spouse Name: James Munoz  Number of Children: N/A  . Years of Education: N/A   Occupational History  . line assembler    Social History Main Topics  . Smoking status: Former Smoker    Types: Cigarettes  . Smokeless tobacco: Never Used     Comment: Pt quit in 1980  . Alcohol Use: No  . Drug Use: No  . Sexual Activity: Not on file   Other Topics Concern  . 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  The patient also denies abdominal pain, nausea, vomiting, dysphagia, diarrhea, constipation, polyuria, polydipsia, dysuria, hematuria, frequency, urgency, abnormal bleeding or bruising, fever, chills, unexpected weight changes, mood swings, change in skin or hair texture, change in voice quality, auditory or visual problems, allergic reactions or rashes, new musculoskeletal complaints other than usual "aches and pains".   PHYSICAL EXAM BP 122/82  Pulse 89  Ht 5\' 8"  (1.727 m)  Wt 203 lb 14.4 oz (92.488 kg)  BMI 31.01 kg/m2  General: Alert, oriented x3, no distress Head: no evidence of trauma, PERRL, EOMI, no exophtalmos or lid lag, no myxedema, no xanthelasma; normal ears, nose and oropharynx Neck: normal jugular venous pulsations and no hepatojugular reflux; brisk carotid pulses without delay and no carotid bruits Chest: clear to auscultation, no signs of consolidation by percussion or palpation, normal fremitus, symmetrical and full respiratory excursions Cardiovascular: normal position and quality of the apical impulse, regular rhythm, normal first and second heart sounds, no murmurs, rubs or gallops Abdomen: no tenderness or distention, no masses by palpation, no abnormal pulsatility or arterial bruits, normal bowel sounds, no hepatosplenomegaly Extremities: no clubbing, cyanosis or edema; 2+ radial, ulnar and brachial pulses bilaterally; 2+ right femoral, posterior tibial and dorsalis pedis pulses; 2+ left femoral, posterior tibial and dorsalis pedis pulses; no subclavian or femoral bruits Neurological: grossly nonfocal  Lipid Panel  No results found for this basename: chol, trig, hdl, cholhdl, vldl, ldlcalc    BMET    Component Value Date/Time   NA 143 07/12/2013 1129   K 5.3* 07/12/2013 1129   CL 96* 07/12/2013 1129   CO2 31* 07/12/2013 1129     ASSESSMENT AND  PLAN Dyspnea His cardiac workup has been unrevealing. There does not appear to be any evidence of significant systolic or diastolic dysfunction or any major vascular abnormality. The symptoms occur at rest and are not likely to be related to coronary insufficiency. His only function tests appear to show an obstructive abnormality that is moderate in severity and has a partial response to bronchodilator therapy. The association of his shortness of breath with laying down was initially concerning for orthopnea, but also raises the concern of acid reflux causing bronchospasm. I see no cardiac contraindication to the use of bronchodilators and have advised him to use the albuterol every 4-6 hours as needed. Have also recommended that he start omeprazole 40 mg daily. We've made a referral to the pulmonary service. Differential diagnosis and treatment of his dyspnea remains elusive, I have offered right and left heart catheterization, but I believe that he most likely has some type of reactive airway problem.  Abnormal CK The pattern is clearly of skeletal muscle origin. The etiology is uncertain. Anti-Jo  1 antibodies were negative. There is no serological evidence of myasthenia gravis, which would not be expected to show elevation in CK or cause dyspnea as his main complaint.   Orders Placed This Encounter  Procedures  . Ambulatory referral to Pulmonology   Meds ordered this encounter  Medications  . albuterol (PROVENTIL HFA;VENTOLIN HFA) 108 (90 BASE) MCG/ACT inhaler    Sig: Inhale into the lungs 2 (two) times daily.  . Probiotic Product (PROBIOTIC DAILY PO)    Sig: Take by mouth daily.  . predniSONE (DELTASONE) 10 MG tablet    Sig: Take 40 mg by mouth daily.  Marland Kitchen omeprazole (PRILOSEC) 40 MG capsule    Sig: Take 1 capsule (40 mg total) by mouth at bedtime.    Dispense:  30 capsule    Refill:  5    James Munoz  Thurmon Fair, MD, [facility] [phone] office [phone]  pager

## 2013-08-17 NOTE — Assessment & Plan Note (Signed)
The pattern is clearly of skeletal muscle origin. The etiology is uncertain. Anti-Jo 1 antibodies were negative. There is no serological evidence of myasthenia gravis, which would not be expected to show elevation in CK or cause dyspnea as his main complaint.

## 2013-08-17 NOTE — Assessment & Plan Note (Addendum)
His cardiac workup has been unrevealing. There does not appear to be any evidence of significant systolic or diastolic dysfunction or any major vascular abnormality. The symptoms occur at rest and are not likely to be related to coronary insufficiency. His only function tests appear to show an obstructive abnormality that is moderate in severity and has a partial response to bronchodilator therapy. The association of his shortness of breath with laying down was initially concerning for orthopnea, but also raises the concern of acid reflux causing bronchospasm. I see no cardiac contraindication to the use of bronchodilators and have advised him to use the albuterol every 4-6 hours as needed. Have also recommended that he start omeprazole 40 mg daily. We've made a referral to the pulmonary service. Differential diagnosis and treatment of his dyspnea remains elusive, I have offered right and left heart catheterization, but I believe that he most likely has some type of reactive airway problem.

## 2013-08-29 ENCOUNTER — Encounter: Payer: Self-pay | Admitting: Internal Medicine

## 2013-08-29 ENCOUNTER — Ambulatory Visit (INDEPENDENT_AMBULATORY_CARE_PROVIDER_SITE_OTHER): Payer: Managed Care, Other (non HMO) | Admitting: Internal Medicine

## 2013-08-29 VITALS — BP 118/72 | HR 86 | Ht 69.0 in | Wt 206.6 lb

## 2013-08-29 DIAGNOSIS — R0600 Dyspnea, unspecified: Secondary | ICD-10-CM

## 2013-08-29 DIAGNOSIS — R0609 Other forms of dyspnea: Secondary | ICD-10-CM

## 2013-08-29 DIAGNOSIS — IMO0001 Reserved for inherently not codable concepts without codable children: Secondary | ICD-10-CM

## 2013-08-29 DIAGNOSIS — R0602 Shortness of breath: Secondary | ICD-10-CM

## 2013-08-29 NOTE — Patient Instructions (Signed)
Please have CPST bike test with Mr James Munoz with EIB challenge  - do next few to several days REturn to see me or my NP To discuss results

## 2013-08-29 NOTE — Assessment & Plan Note (Signed)
My main concern for dyspnea is that this is of neuromuscular in origin especially given the fasciculation and restrictive of normality on pulmonary function test associated with normal diffusion and normal lung parenchyma.  Plan Cardiopulmonary stress test to help delineate reason for shortness of breath We'll hold off on further autoimmune antibody testing because he has just been off prednisone for a few days We will see him again after cardiac pulmonary stress test  Case discussed with neurologist

## 2013-08-29 NOTE — Progress Notes (Signed)
Subjective:    Patient ID: James Munoz, male    DOB: 1960-11-06, 52 y.o.   MRN: [id number] PCP Ezequiel Kayser, MD Rheum: DR Dareen Piano Neuro: Dr Dohmeier  HPI  IOV 08/29/2013  52 year old male referred for dyspnea. Insidious onset of dyspnea 3-6 months ago. Progressive. Brought on by exertion and relieved by rest although the sudden types of exertion like working out in the gym on an [employer] do not make it worse but when he mows the yard it makes it worse. Moderate to severe in severity. Progressive. 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 No associated chest pain no wheezing or cough but there is associated significant orthopnea. A trial of Pulmicort steroid inhaler did not help  Neurologist Dr Vickey Huger 07/12/2013 and was noticed to have a total CK of 1200 and elevated CK-MB of 24 but normal Acetylcholine Receptor antibody and anti-Jo1. ymptoms were associated with the Epworth sleeping score of 21. Evaluation by neurology on that day did not find tongue fasciculations but he was found to have bilateral upper extremities spasms. MRI cervical spine has been ordered and the sleep study has been ordered but still pending. However patient himself started noticing these fasciculations and twitching of his bilateral upper extremities sometime around that time he was started on steroids. He does not know if this preceded or postdated starting steroids  At this point he was started on high-dose steroids for possible myositis. Is concluded that course 2 days ago. He says this has not helped the symptoms. He did followup after this with rheumatologist Dr. Azzie Roup who apparently did not think that he has polymyositis.   Then on 08/14/2013 he did see cardiologist Dr. Salena Saner. patient pulmonary function test 08/12/2013  was noticed to be abnormal [see my interpretation  dictation below] do start her on empiric albuterol and omeprazole. Since then he feels 30% better and his orthopnea has  improved.  At this point he is due to have a sleep study   Pulmonary hx  reports that he quit smoking about 30 years ago. His smoking use included Cigarettes. He has a 8 pack-year smoking history. He has never used smokeless tobacco.   Echocardiogram 07/24/2013 is normal   CLINICAL DATA: Dyspnea. Ex-smoker.  EXAM:  CT CHEST WITH CONTRAST 08/09/13 TECHNIQUE:  Multidetector CT imaging of the chest was performed during  intravenous contrast administration.  CONTRAST: 75mL OMNIPAQUE IOHEXOL 300 MG/ML SOLN  COMPARISON: None.  FINDINGS:  Minimal bibasilar linear atelectasis or scarring. No airspace  consolidation, lung nodules or enlarged lymph nodes. Diffuse low  density of the liver relative to the spleen. 5 mm oval area of low  density in the liver on the right on image number 51. Mild thoracic  spine degenerative changes.  IMPRESSION:  1. Minimal bibasilar atelectasis or scarring.  2. Mild to moderate diffuse hepatic steatosis.  3. 5 mm probable cyst in the liver on the right.  Electronically Signed  By: Gordan Payment M.D.  On: 08/09/2013 16:03   Function test #24 2014 shows FVC of 3.04 L/60%. FEV1 of 2.1 L/57%. Ratio 70/90%. The postbronchodilator FEV1 response of 16% with FEV1 of 2.46/66%. Residual volume is 2 L/100%. Total lung capacity is 5.2 L/76% and mildly reduced. DLCO is 28/90% a normal. The TLC is restriction with spirometry suggests obstruction and restriction.   Walking desaturation test on the office today 08/29/2013: 185 feet x3 laps on room air he did not desaturate  Past Medical History  Diagnosis Date  . Dental  bridge present     lower  . Snores     denies apnea or waking up gasping/choking  . Carpal tunnel syndrome of right wrist 03/2012    weakness and decreased motor skills right hand  . Snoring disorder 07/12/2013  . 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 Asthma     Body mass index is 30.5 kg/(m^2).  No family history on file.   History   Social  History  . Marital Status: Married    Spouse Name: debbie    Number of Children: N/A  . Years of Education: N/A   Occupational History  . line assembler    Social History Main Topics  . Smoking status: Former Smoker -- 1.00 packs/day for 8 years    Types: Cigarettes    Quit date: 08/08/1983  . Smokeless tobacco: Never Used     Comment: Pt quit in 1980  . Alcohol Use: No  . Drug Use: No  . Sexual Activity: Not on file   Other Topics Concern  . 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 acetaminophen (TYLENOL) 325 MG tablet Take 650 mg by mouth every 6 (six) hours as needed.      Marland Kitchen albuterol (PROVENTIL HFA;VENTOLIN HFA) 108 (90 BASE) MCG/ACT inhaler Inhale into the lungs 2 (two) times daily.      Marland Kitchen ibuprofen (ADVIL,MOTRIN) 200 MG tablet Take 400 mg by mouth every 6 (six) hours as needed. Patient uses this medication for pain.      . Multiple Vitamin (MULTIVITAMIN) tablet Take 1 tablet by mouth daily.      Marland Kitchen omeprazole (PRILOSEC) 40 MG capsule Take 1 capsule (40 mg total) by mouth at bedtime.  30 capsule  5  . Probiotic Product (PROBIOTIC DAILY PO) Take by mouth daily.      . predniSONE (DELTASONE) 10 MG tablet Take 40 mg by mouth daily.      . methylPREDNISolone sodium succinate (SOLU-MEDROL) 500 mg in sodium chloride 0.9 % 100 mL IVPB        No facility-administered medications prior to visit.       Review of Systems  Constitutional: Negative for fever and unexpected weight change.  HENT: Negative for congestion, dental problem, ear pain, nosebleeds, postnasal drip, rhinorrhea, sinus pressure, sneezing, sore throat and trouble swallowing.   Eyes: Negative for redness and itching.  Respiratory: Positive for shortness of breath. Negative for cough, chest tightness and wheezing.   Cardiovascular: Negative for  palpitations and leg swelling.  Gastrointestinal: Negative for nausea and vomiting.  Genitourinary: Negative for dysuria.  Musculoskeletal: Negative for joint swelling.  Skin: Negative for rash.  Neurological: Positive for headaches.  Hematological: Does not bruise/bleed easily.  Psychiatric/Behavioral: Negative for dysphoric mood. The patient is not nervous/anxious.        Objective:   Physical Exam  Nursing note and vitals reviewed. Constitutional: He is oriented to person, place, and time. He appears well-developed and well-nourished. No distress.  HENT:  Head: Normocephalic and atraumatic.  Right Ear: External ear normal.  Left Ear: External ear normal.  Mouth/Throat: Oropharynx is clear and moist. No oropharyngeal exudate.  Eyes: Conjunctivae and EOM are normal. Pupils are equal, round, and reactive to light. Right eye exhibits no discharge. Left eye exhibits no discharge. No scleral  icterus.  Neck: Normal range of motion. Neck supple. No JVD present. No tracheal deviation present. No thyromegaly present.  Cardiovascular: Normal rate, regular rhythm and intact distal pulses.  Exam reveals no gallop and no friction rub.   No murmur heard. Pulmonary/Chest: Effort normal and breath sounds normal. No respiratory distress. He has no wheezes. He has no rales. He exhibits no tenderness.  Abdominal: Soft. Bowel sounds are normal. He exhibits no distension and no mass. There is no tenderness. There is no rebound and no guarding.  Musculoskeletal: Normal range of motion. He exhibits no edema and no tenderness.  Lymphadenopathy:    He has no cervical adenopathy.  Neurological: He is alert and oriented to person, place, and time. He has normal reflexes. No cranial nerve deficit. Coordination normal.  Significant twitching and ? Fasciculation in bilateral Upper Extremities  Skin: Skin is warm and dry. No rash noted. He is not diaphoretic. No erythema. No pallor.  Psychiatric: He has a normal  mood and affect. His behavior is normal. Judgment and thought content normal.          Assessment & Plan:

## 2013-08-30 ENCOUNTER — Ambulatory Visit (INDEPENDENT_AMBULATORY_CARE_PROVIDER_SITE_OTHER): Payer: Managed Care, Other (non HMO)

## 2013-08-30 ENCOUNTER — Other Ambulatory Visit: Payer: Self-pay | Admitting: Neurology

## 2013-08-30 DIAGNOSIS — IMO0001 Reserved for inherently not codable concepts without codable children: Secondary | ICD-10-CM

## 2013-08-30 DIAGNOSIS — F5109 Other insomnia not due to a substance or known physiological condition: Secondary | ICD-10-CM

## 2013-08-30 DIAGNOSIS — G4733 Obstructive sleep apnea (adult) (pediatric): Secondary | ICD-10-CM

## 2013-08-30 DIAGNOSIS — R0683 Snoring: Secondary | ICD-10-CM

## 2013-09-03 ENCOUNTER — Ambulatory Visit (HOSPITAL_COMMUNITY): Payer: Managed Care, Other (non HMO) | Attending: Internal Medicine

## 2013-09-03 DIAGNOSIS — R0609 Other forms of dyspnea: Secondary | ICD-10-CM | POA: Insufficient documentation

## 2013-09-03 DIAGNOSIS — R0600 Dyspnea, unspecified: Secondary | ICD-10-CM

## 2013-09-03 DIAGNOSIS — R0989 Other specified symptoms and signs involving the circulatory and respiratory systems: Secondary | ICD-10-CM | POA: Insufficient documentation

## 2013-09-04 ENCOUNTER — Telehealth: Payer: Self-pay | Admitting: Neurology

## 2013-09-04 NOTE — Telephone Encounter (Signed)
Reviewed data, patient was scheduled for Split Night study but his OSA was not severe enough per [employer]'s standards to split.  However, after 2 AM, his OSA became more severe and he will require titration/treatment.  We just didn't have 3 hours left to titrate on the night he was here.  So he will need to return.

## 2013-09-04 NOTE — Telephone Encounter (Signed)
Received call from pt's wife.  Pt says that technician had trouble with a sensor(?) and by the time problem was fixed there was not enough time(?)  He was told that there was not enough information and he would have to come back for another test.  Please return call to pt's wife James Munoz asap.  If he has to do another test, they want an appt right away. [phone]

## 2013-09-10 ENCOUNTER — Telehealth: Payer: Self-pay | Admitting: Neurology

## 2013-09-10 NOTE — Telephone Encounter (Signed)
I called and left a message for the patient to callback to the office, so that I may discuss his sleep study results.

## 2013-09-12 ENCOUNTER — Telehealth: Payer: Self-pay | Admitting: Internal Medicine

## 2013-09-12 DIAGNOSIS — R0602 Shortness of breath: Secondary | ICD-10-CM

## 2013-09-12 NOTE — Telephone Encounter (Signed)
James Munoz  You are seeing him 09/18/13 for review of CPST. Shows ventilator limitation. I think largely restrictive without desats; suggests neuromusclear disoreder esp with his twiching in muscles. However, also shows positive EIB response. So, I suggest you place him on asthma Rx (LABA + ICS) and have him see me back in 3-4 weeks. But needs to follow with neuro as well; I did tell Dr Richardean Chimera to follow him up after first visit  Thanks  Dr. Kalman Shan, M.D., [facility].C.P Pulmonary and Critical Care Medicine Staff Physician [facility] [place] [facility] Pager: [phone], If no answer or between  15:00h - 7:00h: call [phone]  09/12/2013 7:52 PM

## 2013-09-17 ENCOUNTER — Ambulatory Visit (INDEPENDENT_AMBULATORY_CARE_PROVIDER_SITE_OTHER): Payer: Managed Care, Other (non HMO)

## 2013-09-17 DIAGNOSIS — G4733 Obstructive sleep apnea (adult) (pediatric): Secondary | ICD-10-CM

## 2013-09-18 ENCOUNTER — Ambulatory Visit: Payer: Managed Care, Other (non HMO) | Admitting: Adult Health

## 2013-09-24 ENCOUNTER — Telehealth: Payer: Self-pay | Admitting: Neurology

## 2013-09-24 DIAGNOSIS — G4733 Obstructive sleep apnea (adult) (pediatric): Secondary | ICD-10-CM

## 2013-09-24 NOTE — Telephone Encounter (Signed)
I called and spoke with the patient about his recent sleep study result and that Dr. Vickey Hugerohmeier recommends CPAP therapy. Patient understood and I will fax a copy of the report to Dr. Loraine LericheMark Perini's office and mail a copy of the report along with a follow up instruction letter to the patient.

## 2013-09-25 ENCOUNTER — Encounter: Payer: Self-pay | Admitting: *Deleted

## 2013-09-25 ENCOUNTER — Other Ambulatory Visit: Payer: Self-pay | Admitting: *Deleted

## 2013-09-25 DIAGNOSIS — G4733 Obstructive sleep apnea (adult) (pediatric): Secondary | ICD-10-CM

## 2013-09-26 NOTE — Telephone Encounter (Signed)
Pt did not come for his OV .

## 2013-09-27 ENCOUNTER — Telehealth: Payer: Self-pay | Admitting: Neurology

## 2013-09-27 NOTE — Telephone Encounter (Signed)
Pt's wife called because the DME company delivered O2 along with his cpap equipment.  They were not aware that the patient would need oxygen based on his titration report.  Is it for temporary use, long-term, or permanent? Why weren't they told?  What does hypoxia or hypoxemia mean?  Was his severe O2 desaturations due to his chronic asthma?  I gave James Munoz (pt's wife) a general explanation of the findings from the sleep study report, but told her that I would have [name] to call her on Monday or Tuesday to go over the findings from his sleep study in detail and also to address any questions or concerns that them may have.  She feels that the best day to call would be Tuesday when she is off work.  [phone]

## 2013-09-30 NOTE — Telephone Encounter (Signed)
Victorino DikeJennifer  He did not show for OV with TP. Please give him OV to see me to discuss CPST  Thanks  Dr. Kalman ShanMurali Jkai Arwood, M.D., [facility]C.P Pulmonary and Critical Care Medicine Staff Physician [facility] [place] Pulmonary and Critical Care Pager: [phone], If no answer or between  15:00h - 7:00h: call [phone]  09/30/2013 3:33 PM

## 2013-10-01 NOTE — Telephone Encounter (Signed)
[email or web address].Oluwatosin Bracy, CMA  

## 2013-10-02 ENCOUNTER — Telehealth: Payer: Self-pay | Admitting: Neurology

## 2013-10-02 DIAGNOSIS — R748 Abnormal levels of other serum enzymes: Secondary | ICD-10-CM

## 2013-10-02 DIAGNOSIS — R0602 Shortness of breath: Secondary | ICD-10-CM

## 2013-10-02 NOTE — Telephone Encounter (Signed)
Please make sure this patient gets an EMG with NCS, repeat stimulation - for respiratory , bulbar weakness. CK elevation and shortness of breath. Fasciculations reported ( sleep tech noted) . Followed by cervical spine MRI. If already done, ignore this message .

## 2013-10-02 NOTE — Telephone Encounter (Signed)
Called and LM for patient's wife Eunice BlaseDebbie.  Explained I had been out sick yesterday and apologized for not getting back to her on her day off.  Explained that I was happy to discuss in detail Bartosz's sleep study report and explain why oxygen was recommended for him.  I also gave her my personal cell phone number so that she could reach me directly after hours if needed if that was most convenient for her.

## 2013-10-03 ENCOUNTER — Telehealth: Payer: Self-pay | Admitting: Neurology

## 2013-10-03 NOTE — Telephone Encounter (Signed)
lmomtcb x2 on named VM 

## 2013-10-03 NOTE — Telephone Encounter (Signed)
Left message for patient about scheduling nerve conduction and EMG study to be done with Dr. Terrace ArabiaYan. In comment of nerve conduction study please add "repetitive stimulation study needed."

## 2013-10-03 NOTE — Telephone Encounter (Signed)
James Munoz called this morning and I was able to explain to her the need for oxygen and how CPAP and oxygen therapy would work together for James Munoz.  She understood well and was glad for the explanation.  She explains that he is now seeing a Pulmonologist and has been prescribed an inhaler for chronic asthma but it was explained to them that it could take 4-6 weeks for the inhaler to reach full benefit.  I explained that when they visit with Dr. Vickey Munoz, they should be sure and mention that as she may want to have him complete an overnight oximetry test on CPAP and oxygen and on CPAP and room air after his inhaler has reached the point of full treatment and this will let us know if oxygen therapy is something he will need long term.  She understood.

## 2013-10-04 NOTE — Telephone Encounter (Signed)
lmomtcb x 3  

## 2013-10-08 ENCOUNTER — Encounter: Payer: Self-pay | Admitting: *Deleted

## 2013-10-08 NOTE — Telephone Encounter (Signed)
Letter mailed. Carron CurieJennifer Castillo, CMA

## 2013-10-09 ENCOUNTER — Telehealth: Payer: Self-pay | Admitting: Neurology

## 2013-10-09 NOTE — Telephone Encounter (Signed)
Pt needs to schedule a NCV/EMG please allow an hour for the NCV portion of the test.

## 2013-10-10 ENCOUNTER — Telehealth: Payer: Self-pay | Admitting: Neurology

## 2013-10-10 NOTE — Telephone Encounter (Signed)
Left message for patient to call and schedule nerve conduction with EMG test, please add "needs repetitive stimulation" in comment section.

## 2013-10-14 ENCOUNTER — Telehealth: Payer: Self-pay | Admitting: Neurology

## 2013-10-14 NOTE — Telephone Encounter (Signed)
Patient's wife calling on behalf of patient, states she would like a nurse to give her a call and discuss why further testing is needed for patient. Please call and advise.

## 2013-10-14 NOTE — Telephone Encounter (Signed)
Left message for patient's wife that patient needs to have repeat EMG/NCV per his insurance.  This may be followed by CT or MRI of neck if needed.  Relayed that he needs to be followed by a Neuro-muscular specialist.  Dr. Marjory LiesPenumalli has been given patients information.

## 2013-11-04 ENCOUNTER — Encounter: Payer: Self-pay | Admitting: Neurology

## 2013-11-06 NOTE — Telephone Encounter (Signed)
Patient has been scheduled on 12-05-13 with Dr. Terrace ArabiaYan.

## 2013-11-06 NOTE — Progress Notes (Signed)
Quick Note:  Called and spoke to patient and he did get our messages. This was already taking care of. ______

## 2013-11-06 NOTE — Progress Notes (Signed)
Quick Note:  Called and spoke to patient and patient did get our messages he just started a new job and couldn't return calls. Patient coming next Friday to see Dr.Yan. Patient aware he is not seeing Dr. Vickey Hugerohmeier.. Patient verbalized understanding. ______

## 2013-11-12 ENCOUNTER — Encounter: Payer: Self-pay | Admitting: Neurology

## 2013-11-12 ENCOUNTER — Encounter (INDEPENDENT_AMBULATORY_CARE_PROVIDER_SITE_OTHER): Payer: Self-pay

## 2013-11-12 ENCOUNTER — Ambulatory Visit (INDEPENDENT_AMBULATORY_CARE_PROVIDER_SITE_OTHER): Payer: Managed Care, Other (non HMO) | Admitting: Neurology

## 2013-11-12 VITALS — BP 118/80 | HR 76 | Resp 18 | Ht 69.25 in | Wt 210.0 lb

## 2013-11-12 DIAGNOSIS — Z9989 Dependence on other enabling machines and devices: Principal | ICD-10-CM

## 2013-11-12 DIAGNOSIS — G4733 Obstructive sleep apnea (adult) (pediatric): Secondary | ICD-10-CM | POA: Insufficient documentation

## 2013-11-12 NOTE — Patient Instructions (Signed)

## 2013-11-12 NOTE — Progress Notes (Signed)
[facility]  Provider:  Melvyn Novas, M D  Referring Provider: Ezequiel Kayser, MD Primary Care Physician:  Ezequiel Kayser, MD  Chief Complaint  Patient presents with  . Follow-up    Room 10  . Sleep consult    HPI:  James Munoz is a 53 y.o. male  Is seen here as a referral  from Dr. Waynard Edwards for a sleep visit follow up, James Munoz is here today for his first revisit after her sleep study. He underwent a study on 12/12-14 after he had endorsed the Epworth sleepiness scale at 21/24 points. The patient's BMI was 29.8. The study documented an AHI of 18.3 the REM AHI was 44.3 and in supine sleep the AHI was 36.3 per hour of sleep he also had significant oxygen desaturation to 64% at nadir was 280 minutes of desaturation throughout the night it was recommended that he return for a Pap study. The patient was titrated to 11 cm water pressure with a residual AHI of 1.0. Today's download data showed 97% compliance a daily time in CPAP therapy of 6 hours and 45 minutes the residual AHI of 1.0 and a very moderate air leak. Nasal pillow.  He i concerned , that he still may have low oxygen levels.  Since using CPAP he has no longer nocturia and no longer interrupted sleep , he likes using it. He has no longer morning headaches.   The patient had seen cardiology and was released, he had seen Dr Marchelle Gearing  Had chest CT that was negative, and he changed to Dr. Su Monks @ [facility]. He was diagnosed with asthma and treated for both conditions now, OSA and asthma , he feels much improved. He speaks now without gasping for air mid-sentence.   I offered to re evaluate in 3 month , if still weaker, will perform the MRI cervical spine and single -repetitive NCS / EMG . Epworth is now 7 - much lower.      .  Last visit note:   James Munoz reports that over the last couple months or so he has developed shortness of breath a very new symptom  for him. He reports that when he clocks out of work and walks to [employer] walk along resulting hasty causes shortness of breath. He cannot sleep on this back any longer so he prefers to sleep on his right side. He feels that supine sleep does not allow him to breathe well. He has also noted over the last couple of months am decreasing levels of energy he is very fatigued and excessively daytime sleepy. The patient considers himself physically always very active, but recently he was put off by his being short of breath by playing  Disc-golf. This restricted as tolerance to exercise to a degree he has not encounter before. He also states he can fall asleep " at the drop of a hat", before I came into the room he confessed he already took a nap. He likes to take evening walks, but need to rest afterwards.  His nocturnal sleep is characterized by some routines, 9.00 Pm  is his bedtime and his sleep time , he wakes up at 1 AM  and again at 3 AM and 4 .30 . He only estimates his nocturnal sleep time of sound sleep between  3-4 hours. He has trouble to stay asleep, which proceeds his recent development of SOB by 8 month. He wakes up spontaneously and goes to  the bathroom 3 times , but it is not the bathroom break that wakes him. He has trouble to sit in a recliner, feels uncomfortable restricted when reclined.  He sleeps on his side , because he snored when on his back. He chokes when supine. His wife  Prodded him several times at night before he assumed the lateral sleep position.  His cardiologist had tested him. Echo normal, EKG normal. CX ray and  PFT in Dr Perini's office normal, Blood tests normal.  All these physician asked him to persue a sleep test. He wakes up with a dry mouth , and headaches.  The patient endorsed the epworth sleepiness score at 21 points, and FSS at 62 points, both very high. He is less productive at work, he started a new job 6 month ago and at that time was not affected. All symptoms  started at the time of the new employment. He works not around vapors or dust.  He makes Sport and exercise psychologist.   Important : The patient is a Medical laboratory scientific officer , and has not enough wind to produce the sound.   He has  Had well toned, strong respiratory muscles and a normal upper airway. Last years carpal tunnel surgery by Dr. Teressa Senter still left him with numbness in the left hand,      Review of Systems: Out of a complete 14 system review, the patient complains of only the following symptoms, and all other reviewed systems are negative. Weight gain.   History   Social History  . Marital Status: Married    Spouse Name: [name]    Number of Children: 2  . Years of Education: 14   Occupational History  . line assembler   .     Social History Main Topics  . Smoking status: Former Smoker -- 1.00 packs/day for 8 years    Types: Cigarettes    Quit date: 08/08/1983  . Smokeless tobacco: Never Used     Comment: Pt quit in 1980  . Alcohol Use: No  . Drug Use: No  . Sexual Activity: Not on file   Other Topics Concern  . 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   Patient has a Scientist, research (physical sciences).   Patient is right-handed.   Patient drinks one cup of coffee, 1 can of soda daily.   Patient has two children.             History reviewed. No pertinent family history.  Past Medical History  Diagnosis Date  . Dental bridge present     lower  . Snores     denies apnea or waking up gasping/choking  . Carpal tunnel syndrome of right wrist 03/2012    weakness and decreased motor skills right hand  . Snoring disorder 07/12/2013  . 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 Asthma     Past Surgical History  Procedure Laterality Date  . Appendectomy    . Carpal tunnel release  04/06/2012    Procedure: CARPAL TUNNEL RELEASE;  Surgeon: Wyn Forster., MD;  Location: [facility];   Service: Orthopedics;  Laterality: Right;  . Tendon exploration  08/21/2012    Procedure: TENDON EXPLORATION;  Surgeon: Wyn Forster., MD;  Location: [facility];  Service: Orthopedics;  Laterality: Right;  Wide Exploration of Carpal Canal , Cyst Excision Right    . Ulnar  nerve transposition  08/21/2012    Procedure: ULNAR NERVE DECOMPRESSION/TRANSPOSITION;  Surgeon: Wyn Forsterobert V Sypher Jr., MD;  Location: [facility];  Service: Orthopedics;  Laterality: Right;  Decompression Ulnar Nerve      Current Outpatient Prescriptions  Medication Sig Dispense Refill  . acetaminophen (TYLENOL) 325 MG tablet Take 650 mg by mouth every 6 (six) hours as needed.      Marland Kitchen. albuterol (PROVENTIL HFA;VENTOLIN HFA) 108 (90 BASE) MCG/ACT inhaler Inhale into the lungs 2 (two) times daily.      . Multiple Vitamin (MULTIVITAMIN) tablet Take 1 tablet by mouth daily.      Marland Kitchen. omeprazole (PRILOSEC) 40 MG capsule Take 1 capsule (40 mg total) by mouth at bedtime.  30 capsule  5  . Probiotic Product (PROBIOTIC DAILY PO) Take by mouth daily.      . SYMBICORT 80-4.5 MCG/ACT inhaler        No current facility-administered medications for this visit.    Allergies as of 11/12/2013  . (No Known Allergies)    Vitals: BP 118/80  Pulse 76  Resp 18  Ht 5' 9.25" (1.759 m)  Wt 210 lb (95.255 kg)  BMI 30.79 kg/m2 Last Weight:  Wt Readings from Last 1 Encounters:  11/12/13 210 lb (95.255 kg)   Last Height:   Ht Readings from Last 1 Encounters:  11/12/13 5' 9.25" (1.759 m)    Physical exam:  General: The patient is awake, alert and appears not in acute distress. The patient is well groomed. Head: Normocephalic, atraumatic. Neck is supple. Mallampati 4, Lateral narrowing, neck circumference:15,  Cardiovascular:  Regular rate and rhythm  without  murmurs or carotid bruit, and without distended neck veins. Respiratory: Lungs are clear to auscultation. Skin:  Without evidence of edema, or  rash Trunk: BMI is normal,  posture.  Neurologic exam : The patient is awake and alert, oriented to place and time.  Memory subjective described as intact. There is a normal attention span & concentration ability.  Speech is fluent without  dysarthria, dysphonia or aphasia.  Mood and affect are appropriate.  Cranial nerves: Pupils are equal and briskly reactive to light. Funduscopic exam without  evidence of pallor or edema. Extraocular movements  in vertical and horizontal planes intact and without nystagmus. Visual fields by finger perimetry are intact. Hearing to finger rub intact.  Facial sensation intact to fine touch. Facial motor strength is symmetric and tongue and uvula move midline.  Motor exam:      normal muscle bulk  and tone,  grip strength, left sided  atrophy of the thenar eminence. Decreased Pinch strength.  ROM for wrist and ankle . extremities.  Sensory:  Fine touch, pinprick and vibration were tested in all extremities. Proprioception is normal.  Coordination: Rapid alternating movements in the fingers/hands is tested and attenuated. .  Finger-to-nose maneuver tested and normal without evidence of ataxia, dysmetria or tremor.  Gait and station: Patient walks without assistive device and is able and assisted stool climb up to the exam table. Strength within normal limits.  Stance is stable and normal. Tandem gait is fragmented. Romberg testing is normal.  normal.  Deep tendon reflexes: in the  upper and lower extremities are symmetric and intact.  Babinski maneuver response is downgoing.   Assessment:  After physical and neurologic examination, review of  Imaging, and pre-existing records, assessment is that of OSA and asthma, now n both conditions are treated.  The patient has noted less fasciculations, less leg cramps ,  uses magnesium.  Continues to use CPAP at current level, RV in 3-4 month for neuromuscular evaluation if he still has proximal weakness.

## 2013-11-28 ENCOUNTER — Encounter: Payer: Self-pay | Admitting: Neurology

## 2013-12-05 ENCOUNTER — Ambulatory Visit: Payer: Self-pay | Admitting: Neurology

## 2013-12-24 ENCOUNTER — Telehealth: Payer: Self-pay | Admitting: Neurology

## 2013-12-24 DIAGNOSIS — G4733 Obstructive sleep apnea (adult) (pediatric): Secondary | ICD-10-CM

## 2013-12-24 DIAGNOSIS — J45909 Unspecified asthma, uncomplicated: Secondary | ICD-10-CM

## 2013-12-24 DIAGNOSIS — R0902 Hypoxemia: Secondary | ICD-10-CM

## 2013-12-24 NOTE — Telephone Encounter (Signed)
Pt and spouse come into office today requesting an overnight pulse oximetry.  Pt says that he was told by Dr. Vickey Hugerohmeier during his last visit that he could just come by and that we would set him up.  Advised the patient that he may have been confused but I would send a request to the physician.    Pt is requesting an overnight pulse oximetry to evaluate his need for nocturnal O2 since his severe asthma is now being successfully treated.  Feels that sleep study may have shown that he needed oxygen because of the untreated asthma during the time of the test.

## 2013-12-25 NOTE — Telephone Encounter (Signed)
LM for patient explaining that [facility] and they will contact him to arrange a time/date for this testing.  They will then get the information to Dr. Vickey Hugerohmeier for her review.  [facility] 11:11 AM 12/25/13

## 2013-12-31 ENCOUNTER — Encounter: Payer: Self-pay | Admitting: Neurology

## 2014-01-02 ENCOUNTER — Encounter: Payer: Self-pay | Admitting: Neurology

## 2014-01-16 ENCOUNTER — Encounter: Payer: Self-pay | Admitting: Neurology

## 2014-01-24 ENCOUNTER — Encounter: Payer: Self-pay | Admitting: Neurology

## 2014-01-24 NOTE — Telephone Encounter (Signed)
Patients ONO on 01-13-14 shows that the patient when using CPAP and 2 L of oxygen he spent  9.6% of the time in desaturation at or below 90% Having him  only using CPAP without oxygen,  that time at or below 90% oxygen saturation was 42.7% of the time -a significant amount in desaturation.  The  Nadir was  Lower than  70%.  Conclusion:  the patient continues to need CPAP and oxygen supplement.  James Novasarmen Ladarrell Cornwall, MD

## 2014-02-17 ENCOUNTER — Ambulatory Visit: Payer: Managed Care, Other (non HMO) | Admitting: Neurology

## 2014-05-02 ENCOUNTER — Encounter: Payer: Self-pay | Admitting: Neurology

## 2014-05-02 ENCOUNTER — Ambulatory Visit (INDEPENDENT_AMBULATORY_CARE_PROVIDER_SITE_OTHER): Payer: Managed Care, Other (non HMO) | Admitting: Neurology

## 2014-05-02 VITALS — BP 110/78 | HR 77 | Ht 69.29 in | Wt 199.1 lb

## 2014-05-02 DIAGNOSIS — R748 Abnormal levels of other serum enzymes: Secondary | ICD-10-CM

## 2014-05-02 DIAGNOSIS — M62838 Other muscle spasm: Secondary | ICD-10-CM

## 2014-05-02 DIAGNOSIS — G1221 Amyotrophic lateral sclerosis: Secondary | ICD-10-CM

## 2014-05-02 DIAGNOSIS — G1229 Other motor neuron disease: Secondary | ICD-10-CM

## 2014-05-02 DIAGNOSIS — R5381 Other malaise: Secondary | ICD-10-CM

## 2014-05-02 DIAGNOSIS — R531 Weakness: Secondary | ICD-10-CM

## 2014-05-02 DIAGNOSIS — M6289 Other specified disorders of muscle: Secondary | ICD-10-CM

## 2014-05-02 DIAGNOSIS — R5383 Other fatigue: Secondary | ICD-10-CM

## 2014-05-02 DIAGNOSIS — R252 Cramp and spasm: Secondary | ICD-10-CM

## 2014-05-02 LAB — CK: CK TOTAL: 2366 U/L — AB (ref 7–232)

## 2014-05-02 MED ORDER — GABAPENTIN 100 MG PO CAPS: 100.0000 mg | ORAL_CAPSULE | Freq: Every day | ORAL | Status: DC

## 2014-05-02 NOTE — Patient Instructions (Addendum)
1.  Start neurontin 100mg  at bedtime for muscle cramps, if it makes you too sleepy, ok to stop the medication.   2.  Start occupational therapy and physical therapy 3.  NCS/EMG of the right arm and leg  - we will contact you for a sooner appointment in the next few weeks  4.  Check blood work 5.  MRI cervical spine and brain wwo contrast 6.  Return to clinic 4 weeks

## 2014-05-02 NOTE — Progress Notes (Signed)
[facility] Clinic Note - Initial Visit   Date: 05/05/2014  James Munoz MRN: [id number] DOB: 02/27/1961   Dear Dr Waynard EdwardsPerini:  Thank you for your kind referral of James Munoz for consultation of orthopnea and weakness. Although his history is well known to you, please allow us to reiterate it for the purpose of our medical record. The patient was accompanied to the clinic by wife James Munoz([name]) who also provides collateral information.     History of Present Illness: James Munoz is a 53 y.o. right-handed Caucasian male with history of exercise-induced asthma (diagnoesd 2014), OSA on CPAP, and right CTS s/p release with redo (2013) and R ulnar transposition presenting for evaluation of orthopnea and weakness.    Starting in 2013, he developed slow onset of shortness of breath, especially with exertion. His shortness of breath is much worse when laying flat. He is unable to sleep on his back, even propped up on several pillows, so sleep on his side.  Even laying back in a recliner causes orthopnea.  He saw his PCP who started trial of inhalers, but there was no improvement so was referred to cardiology.  His cardiac evaluation included echocardiogram and cardiac stress test and returned normal.   Because of problems with snoring, he was evaluted by Dr. Vickey Hugerohmeier for sleep evaluation.  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  He was given solumedrol 500mg  IV x 2 and subsequently started on prednisone 80mg  daily for month until he was seen by Dr. Dareen PianoAnderson, rheumology, at the end of 2014 who did not find evidence of polymyositis so tapered him off steroids.  Since early 2015, he has started developing painless muscle twitches of his arms and chest. Over the past several months, he has also noticed weakness of his arms and hands. He has difficulty with fine motor movements such as fastening buttons, pulling up zips,  and feeding himself. He is dropping things more than usual. He also has noted mild difficulty climbing stairs.  He endorses pain muscle cramps especially of his legs and hands. He denies any dark or tea-colored urine. He has no problems with swallowing or talking.  Mood is good. Appetite is good. He has lost about 10 pounds over the past 6 months and noticed his muscles have become smaller.    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   Past Medical History  Diagnosis Date  . Dental bridge present     lower  . Snores     denies apnea or waking up gasping/choking  . Carpal tunnel syndrome of right wrist 03/2012    weakness and decreased motor skills right hand  . Snoring disorder 07/12/2013  . 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 Asthma     Past Surgical History  Procedure Laterality Date  . Appendectomy    . Carpal tunnel release  04/06/2012    Procedure: CARPAL TUNNEL RELEASE;  Surgeon: Wyn Forsterobert V Sypher Jr., MD;  Location: [facility];  Service: Orthopedics;  Laterality: Right;  . Tendon exploration  08/21/2012    Procedure: TENDON EXPLORATION;  Surgeon: Wyn Forsterobert V Sypher Jr., MD;  Location: [facility];  Service: Orthopedics;  Laterality: Right;  Wide Exploration of Carpal Canal , Cyst Excision Right    . Ulnar nerve transposition  08/21/2012    Procedure: ULNAR NERVE DECOMPRESSION/TRANSPOSITION;  Surgeon: Wyn Forsterobert V Sypher Jr., MD;  Location: [facility];  Service: Orthopedics;  Laterality: Right;  Decompression Ulnar Nerve       Medications:  Current Outpatient Prescriptions on File Prior to Visit  Medication Sig Dispense Refill  . acetaminophen (TYLENOL) 325 MG tablet Take 650 mg by mouth every 6 (six) hours as needed.      Marland Kitchen albuterol (PROVENTIL HFA;VENTOLIN HFA) 108 (90 BASE) MCG/ACT inhaler Inhale into the lungs 2 (two) times daily.      . Multiple Vitamin  (MULTIVITAMIN) tablet Take 1 tablet by mouth daily.      Marland Kitchen omeprazole (PRILOSEC) 40 MG capsule Take 1 capsule (40 mg total) by mouth at bedtime.  30 capsule  5  . Probiotic Product (PROBIOTIC DAILY PO) Take by mouth daily.      . SYMBICORT 80-4.5 MCG/ACT inhaler        No current facility-administered medications on file prior to visit.    Allergies: No Known Allergies  Family History: Family History  Problem Relation Age of Onset  . CAD Father     Deceased, 72  . Other Mother     Living  . Arthritis Mother   . Hepatitis C Sister   . Epilepsy Son   . Healthy Son     Social History: History   Social History  . Marital Status: Married    Spouse Name: [name]    Number of Children: 2  . Years of Education: 14   Occupational History  . line assembler   .     Social History Main Topics  . Smoking status: Former Smoker -- 1.00 packs/day for 8 years    Types: Cigarettes    Quit date: 08/08/1983  . Smokeless tobacco: Never Used     Comment: Pt quit in 1980  . Alcohol Use: No  . Drug Use: No  . Sexual Activity: Not on file   Other Topics Concern  . 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   Patient has a Scientist, research (physical sciences).   Patient is right-handed.   Patient drinks one cup of coffee, 1 can of soda daily.   Patient has two children.             Review of Systems:  CONSTITUTIONAL: No fevers, chills, night sweats, + 10lb weight loss.   EYES: No visual changes or eye pain ENT: No hearing changes.  No history of nose bleeds.   RESPIRATORY: No cough, wheezing ++shortness of breath.   CARDIOVASCULAR: Negative for chest pain, and palpitations.   GI: Negative for abdominal discomfort, blood in stools or black stools.  No recent change in bowel habits. GU:  No history of incontinence.   MUSCLOSKELETAL: No history of joint pain or swelling.  No myalgias.   SKIN: Negative for lesions, rash,  and itching.   HEMATOLOGY/ONCOLOGY: Negative for prolonged bleeding, bruising easily, and swollen nodes.  No history of cancer.   ENDOCRINE: Negative for cold or heat intolerance, polydipsia or goiter.   PSYCH:  No depression or anxiety symptoms.   NEURO: As Above.   Vital Signs:  BP 110/78  Pulse 77  Ht 5' 9.29" (1.76 m)  Wt 199 lb 1 oz (90.294 kg)  BMI 29.15 kg/m2  SpO2 92%   General Medical Exam:  General:  Well appearing, notable atrophy of intrinsic hand muscles, comfortable.   Eyes/ENT: see cranial nerve examination.   Neck: No masses  appreciated.  Full range of motion without tenderness.  No carotid bruits. Respiratory:  Clear to auscultation, good air entry bilaterally.  He is able to count to 26 on deep inhalation Cardiac:  Regular rate and rhythm, no murmur.   Extremities:  No deformities, edema, or skin discoloration. Good capillary refill.   Skin:  Skin color, texture, turgor normal. No rashes or lesions.  Neurological Exam: MENTAL STATUS including orientation to time, place, person, recent and remote memory, attention span and concentration, language, and fund of knowledge is normal.  Speech is not dysarthric. He is able to enunciate lingual and guttural sounds without difficulty.  CRANIAL NERVES: II:  No visual field defects.  Unremarkable fundi.   III-IV-VI: Pupils equal round and reactive to light.  Normal conjugate, extra-ocular eye movements in all directions of gaze.  No nystagmus.  No ptosis.   V:  Normal facial sensation.  Jaw jerk is absent.   VII:  Normal facial symmetry and movements.  No pathologic facial reflexes.  VIII:  Normal hearing and vestibular function.   IX-X:  Normal palatal movement.   XI:  Normal shoulder shrug and head rotation.   XII:  Normal tongue strength and range of motion, no deviation or fasciculation.  MOTOR:  Significant atrophy of the intrinsic hand muscles bilaterally.  Very active fasciculations of the proximal upper extremity,  including infraspinatus, moderate fasciculations of the quadriceps bilaterally.  No pronator drift.  Tone is reduced in the arm and increased in the legs. Neck flexion  5/5 Neck extension 5/5  Right Upper Extremity:    Left Upper Extremity:    Deltoid  4/5   Deltoid  4/5   Biceps  5-/5   Biceps  5/5   Triceps  5/5   Triceps  5-/5   Infraspinatus 4/5  Infraspinatus 4/5  Medial pectoralis 4+/5  Medial pectoralis 4+/5  Wrist extensors  5-/5   Wrist extensors  5/5   Wrist flexors  5/5   Wrist flexors  5/5   Finger extensors  5/5   Finger extensors  5/5   Finger flexors  4+/5   Finger flexors  4+/5   Dorsal interossei  4/5   Dorsal interossei  4/5   Abductor pollicis  4/5   Abductor pollicis  4/5   Tone (Ashworth scale)  0  Tone (Ashworth scale)  0   Right Lower Extremity:    Left Lower Extremity:    Hip flexors  4/5   Hip flexors  4/5   Hip extensors  4+/5   Hip extensors  4+/5   Abductor  5/5  Abductor  5/5  Abductor  5/5  Adductor  5/5  Knee flexors  5/5   Knee flexors  5/5   Knee extensors  5/5   Knee extensors  5/5   Dorsiflexors  5/5   Dorsiflexors  5/5   Plantarflexors  5/5   Plantarflexors  5/5   Toe extensors  5/5   Toe extensors  5/5   Toe flexors  5/5   Toe flexors  5/5   Tone (Ashworth scale)  1  Tone (Ashworth scale)  1   MSRs:  Right  Left brachioradialis 2+  brachioradialis 3+  biceps 1+  biceps 3+  triceps 2+  triceps 2+  patellar 2+  patellar 2+  ankle jerk 1+  ankle jerk 2+  Hoffman no  Hoffman no  plantar response down  plantar response down  No clonus.  SENSORY:  Normal and symmetric perception of light touch, pinprick, vibration, and proprioception.  Romberg's sign absent.   COORDINATION/GAIT: Finger tapping is imprecise due to weakness. There is no dysmetria with finger to nose testing.  Able to rise from a chair without using arms.  Gait narrow based and stable. Stressed gait intact.  He has  difficulty with performing tandem gait.   Data Lab Results  Component Value Date   CKTOTAL 1241* 07/12/2013   CKMBINDEX 24.4* 07/12/2013   Labs 07/12/2013:  Acetylcholine receptor binding antibody negative, anti-Jo negative  EMG 08/06/2012: Bilateral carpal tunnel syndrome, moderate to severe in degree electrically and worse on the right. Mild ulnar neuropathy at the elbow.  Cardiopulmonary exercise test 09/03/2013: Exercise testing with gas exchange demonstrates a normal functional capacity when compared to matched sedentary norms. There was a clear ventilatory limitation the exercise, with an overall borderline restrictive response. Post-exercise spirometry was suggestive of an EIB response. There was no clear circulatory limitations to the exercise.   IMPRESSION: James Munoz is a delightful 53 year-old man presenting for evaluation of progressive shortness of breath, generalized weakness, and muscle atrophy.  There is evidence of upper (increased tone, asymmetrically left hypperreflexia) and lower motor neuron (active fasciculations, weakness) findings on exam.    Although his CK was in 1200s last year which is more suggestive of muscle disease, findings on his exam are concerning for motor neuron disease.  Other possibilities include multifocal motor neuropathy with conduction block especially with his history of bilateral CTS and right ulnar neuropathy, but hyperCKemia would be very atypical; prominent orthopnea and elevated muscle enzymes can be seen in adult-onset Pompe's disease, however fasciculations are uncommon; inclusion body myositis is another possibility especially with hyperCKemia, preferential quadriceps and finger flexor weakness.  To help characterize the nature of his symptoms (neuropathy vs myopathy), electrodiagnostic testing will be performed.  My suspicion for myopathy or late onset muscular dystrophy is low.  I do agree with Dr. Dareen Piano that he does NOT have clinical  findings of polymyosisis and am glad that he is taken off corticosteroids.  Because of his upper motor neuron findings, I will also order MRI brain and cervical spine wwo contrast.     PLAN/RECOMMENDATIONS:  1.  Start neurontin 100mg  at bedtime for muscle cramps.  Risks and benefits discussed. 2.  Start occupational therapy and physical therapy 3.  NCS/EMG of the right arm and leg  4.  Check CK, aldolase, TSH, copper, SPEP/UPEP with IFE 5.  MRI cervical spine and brain wwo contrast 6.  Return to clinic 4 weeks   The duration of this appointment visit was 65 minutes of face-to-face time with the patient.  Greater than 50% of this time was spent in counseling, explanation of diagnosis, planning of further management, and coordination of care.   Thank you for allowing me to participate in patient's care.  If I can answer any additional questions, I would be pleased to do so.    Sincerely,    Donika K. Allena Katz, DO

## 2014-05-03 LAB — ALDOLASE: Aldolase: 31.7 U/L — ABNORMAL HIGH (ref <=8.1)

## 2014-05-03 LAB — TSH: TSH: 2.111 u[IU]/mL (ref 0.350–4.500)

## 2014-05-04 LAB — COPPER, SERUM: Copper: 104 ug/dL (ref 70–175)

## 2014-05-05 ENCOUNTER — Telehealth: Payer: Self-pay | Admitting: Neurology

## 2014-05-05 NOTE — Telephone Encounter (Signed)
Pt wife called and states that the patient has had a tick bite last summer he just remembered and wanted you to know. Pt wife phone is [phone] if you need it

## 2014-05-05 NOTE — Progress Notes (Signed)
Note faxed.

## 2014-05-05 NOTE — Telephone Encounter (Signed)
Patient called back and wants to wait to do MRI until you are informed about the tick bite.  He thinks that may be the cause of his problems.  He is going to call and have the MRI pushed back for about 1 week.

## 2014-05-06 ENCOUNTER — Other Ambulatory Visit: Payer: Managed Care, Other (non HMO)

## 2014-05-06 LAB — UIFE/LIGHT CHAINS/TP QN, 24-HR UR
ALBUMIN, U: DETECTED
ALPHA 1 UR: DETECTED — AB
ALPHA 2 UR: DETECTED — AB
Beta, Urine: DETECTED — AB
FREE KAPPA/LAMBDA RATIO: 13 ratio — AB (ref 2.04–10.37)
Free Kappa Lt Chains,Ur: 0.78 mg/dL (ref 0.14–2.42)
Free Lambda Lt Chains,Ur: 0.06 mg/dL (ref 0.02–0.67)
Gamma Globulin, Urine: DETECTED — AB
TOTAL PROTEIN, URINE-UPE24: 1.3 mg/dL

## 2014-05-06 LAB — SPEP & IFE WITH QIG
ALBUMIN ELP: 62.2 % (ref 55.8–66.1)
ALPHA-1-GLOBULIN: 3.6 % (ref 2.9–4.9)
Alpha-2-Globulin: 8.8 % (ref 7.1–11.8)
Beta 2: 4.4 % (ref 3.2–6.5)
Beta Globulin: 6.3 % (ref 4.7–7.2)
Gamma Globulin: 14.7 % (ref 11.1–18.8)
IGM, SERUM: 111 mg/dL (ref 41–251)
IgA: 260 mg/dL (ref 68–379)
IgG (Immunoglobin G), Serum: 1120 mg/dL (ref 650–1600)
Total Protein, Serum Electrophoresis: 7.3 g/dL (ref 6.0–8.3)

## 2014-05-07 ENCOUNTER — Other Ambulatory Visit: Payer: Managed Care, Other (non HMO)

## 2014-05-07 ENCOUNTER — Telehealth: Payer: Self-pay | Admitting: Neurology

## 2014-05-07 ENCOUNTER — Other Ambulatory Visit: Payer: Self-pay | Admitting: *Deleted

## 2014-05-07 ENCOUNTER — Other Ambulatory Visit: Payer: Self-pay | Admitting: Neurology

## 2014-05-07 DIAGNOSIS — W57XXXA Bitten or stung by nonvenomous insect and other nonvenomous arthropods, initial encounter: Secondary | ICD-10-CM

## 2014-05-07 NOTE — Telephone Encounter (Signed)
Lab ordered.  Gave instructions to patient's wife to have him come pick up order and then go to the lab.

## 2014-05-07 NOTE — Telephone Encounter (Signed)
I spoke with patient's wife and informed her that I would call back with results after Dr. Allena KatzPatel reviews them.  Lab ordered for lyme.

## 2014-05-07 NOTE — Telephone Encounter (Signed)
Returned call to patient.  Labs discussed, patient notified CK remains elevated.  EMG pending.  Vanessia Bokhari K. Allena KatzPatel, DO

## 2014-05-07 NOTE — Telephone Encounter (Signed)
Called patient at both cell phone number and home number, however there was no answer so message was left to return my call.  Labs indicate elevated CK and aldolase, which is most suggestive muscle injury. The remainder of labs was within normal limits.  He has been scheduled for an EMG to determine whether this is myopathic or neurogenic in origin.  Donika K. Allena KatzPatel, DO

## 2014-05-07 NOTE — Telephone Encounter (Signed)
OK to order Lyme.    Wilhelm Ganaway K. Allena KatzPatel, DO

## 2014-05-07 NOTE — Telephone Encounter (Signed)
Pt called returning your call regarding his lab results. C/B [phone]

## 2014-05-07 NOTE — Telephone Encounter (Signed)
Pt's spouse called f/u on the results for his lab work and also would like for Dr. Allena KatzPatel to order more blood work. Please call pt's spouse. C/B  [phone]

## 2014-05-07 NOTE — Telephone Encounter (Signed)
Do you want me to order Lyme lab?

## 2014-05-08 LAB — LYME AB/WESTERN BLOT REFLEX: B BURGDORFERI AB IGG+ IGM: 0.53 {ISR}

## 2014-05-12 ENCOUNTER — Telehealth: Payer: Self-pay | Admitting: Neurology

## 2014-05-12 NOTE — Telephone Encounter (Signed)
Gave wife lab results.

## 2014-05-12 NOTE — Telephone Encounter (Signed)
Pt called/returning your call at 12:17PM. He would like for you To call his wife since he is unable to speak while being at work. Her # [phone]

## 2014-05-12 NOTE — Telephone Encounter (Signed)
Pt called/returning at 10:50AM your call regarding results. [phone]

## 2014-05-14 ENCOUNTER — Ambulatory Visit (INDEPENDENT_AMBULATORY_CARE_PROVIDER_SITE_OTHER): Payer: Managed Care, Other (non HMO) | Admitting: Neurology

## 2014-05-14 DIAGNOSIS — G1221 Amyotrophic lateral sclerosis: Secondary | ICD-10-CM

## 2014-05-14 DIAGNOSIS — G1220 Motor neuron disease, unspecified: Secondary | ICD-10-CM

## 2014-05-14 NOTE — Procedures (Signed)
[facility] Neurology  [address], [address]  [place], [place] [address] Tel: [phone] Fax:  [phone] Test Date:  05/14/2014  Patient: James Munoz DOB: 10/21/1960 Physician: Nita Sickle, DO  Sex: Male Height:  Ref Phys: Nita Sickle  ID#: [id number] Temp: 31.0C Technician: Ala Bent R. NCS T.   Patient Complaints: Patient is a 53 year old male here for evaluation of muscle fasciculations, atrophy of muscles, weakness and difficulty breathing.  NCV & EMG Findings: Extensive electrodiagnostic testing of the right upper extremity, lower extremity, thoracic paraspinal muscles (T6 and T11), and bulbar muscles shows: 1. All sensory responses including the median, ulnar, radial, sural, and superficial peroneal sensory responses are within normal limits 2. The right median motor response is absent. The ulnar motor response is asymmetrically reduced at the first dorsal interosseous muscle and when recording at the abductor digiti minimi, it is mildly reduced (6.1 mV). There is evidence of conduction velocity slowing across the elbow (A Elbow-B Elbow, 43 m/s).  The tibial and peroneal motor responses are within normal limits. 3. Active on chronic motor axon loss changes are seen affecting C5-C8 and L2-S1 myotomes with more prominent findings in the upper extremity. 4. Active motor axon loss is present in the thoracic paraspinal muscle at T6 only. 5. Chronic motor axon loss changes present in the mentalis masseter muscles, with increased insertional activity in the mentalis muscle.  6. Fasciculation potentials are very active and seen in 10 of 23 tested muscles.   Impression: 1. 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  2. 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 3. There is no definite evidence of a generalized myopathy or peripheral neuropathy.    ___________________________ Nita Sickle, DO    Nerve Conduction Studies Anti Sensory Summary Table   Site NR Peak (ms) Norm Peak (ms) P-T Amp (V) Norm P-T Amp  Right Median Anti Sensory (2nd Digit)  31C  Wrist    3.3 <3.6 30.1 >15  Right Radial Anti Sensory (Base 1st Digit)  31C  Wrist    2.5 <2.7 15.7 >14  Right Sup Peroneal Anti Sensory (Ant Lat Mall)  31C  12 cm    3.1 <4.6 8.3 >4  Right Sural Anti Sensory (Lat Mall)  31C  Calf    4.6 <4.6 12.1 >4  Right Ulnar Anti Sensory (5th Digit)  31C  Wrist    3.1 <3.1 21.2 >10   Motor Summary Table   Site NR Onset (ms) Norm Onset (ms) O-P Amp (mV) Norm O-P Amp Site1 Site2 Delta-0 (ms) Dist (cm) Vel (m/s) Norm Vel (m/s)  Right Median Motor (Abd Poll Brev)  31C  Wrist NR  <4.0  >6        Right Peroneal Motor (Ext Dig Brev)  31C  Ankle    5.5 <6.0 3.9 >2.5 B Fib Ankle 7.5 32.0 43 >40  B Fib    13.0  3.9  Poplt B Fib 1.8 9.0 50 >40  Poplt    14.8  3.8         Right Peroneal TA Motor (Tib Ant)  31C  Fib Head    2.5 <4.5 5.2 >3 Poplit Fib Head 2.0 10.0 50 >40  Poplit  4.5  4.9         Right Tibial Motor (Abd Hall Brev)  31C  Ankle    4.5 <6.0 6.5 >4 Knee Ankle 9.7 41.0 42 >40  Knee    14.2  4.2         Right Ulnar Motor (Abd Dig Minimi)  31C  Wrist    2.6 <3.1 6.1 >7 B Elbow Wrist 3.6 22.0 61 >50  B Elbow    6.2  5.8  A Elbow B Elbow 2.3 10.0 43 >50  A Elbow    8.5  5.7         Right Ulnar (FDI) Motor (1st DI)  31C  Wrist    4.3 <4.5 1.5 >7 Post-exercise Wrist 0.0 0.0  >50  Post-exercise    4.3  1.6          F Wave Studies   NR F-Lat (ms) Lat Norm (ms) L-R F-Lat (ms)  Right Tibial (Mrkrs) (Abd Hallucis)  31C     54.96 <55   Right Ulnar (Mrkrs) (Abd Dig Min)  31C     30.04 <33    EMG   Side Muscle Ins Act Fibs Psw Fasc Number Recrt Dur Dur. Amp  Amp. Poly Poly. Comment  Right VastusLat Nml Nml Nml 1+ 1- Mod-R Some 1+ Some 1+ Nml Nml N/A  Right Biceps Nml Nml Nml 1+ 2- Rapid Some 1+ Some 1+ Nml Nml MMAV  Right GluteusMed Nml 1+ Nml 1+ 2- Mod-R Some 1+ Some 1+ Nml Nml N/A  Right Ext Indicis Nml Nml Nml 1+ 2- Rapid Many 1+ Some 1+ Few 1+ N/A  Right RectFemoris Nml Nml Nml 1+ 2- Mod-R Some 1+ Some 1+ Nml Nml N/A  Right Gastroc Nml 1+ Nml 1+ 1- Mod-R Few 1+ Few 1+ Nml Nml N/A  Right Deltoid Nml 1+ Nml 1+ 1- Mod-R Some 1+ Some 1+ Nml Nml N/A  Right AntTibialis Nml 1+ Nml 1+ 1- Mod-R Few 1+ Few 1+ Nml Nml N/A  Right Triceps Nml 1+ Nml 3+ 2- Mod-R Some 1+ Some 1+ Nml Nml N/A  Right 1stDorInt 1+ Nml Nml Nml 3- Rapid Most 1+ Many 1+ Nml Nml ATR  Right Abd Poll Brev 1- Nml Nml Nml NE None - - - - - - ATR  Right Cervical Parasp Low Nml Nml Nml Nml Nml Nml Nml Nml Nml Nml Nml Nml N/A  Right Thoracic Parasp Mid Nml 1+ Nml Nml NE - - - - - - - N/A  Right Thoracic Parasp Low Nml Nml Nml Nml NE - - - - - - - N/A  Right BicepsFemS Nml Nml Nml Nml Nml Nml Nml Nml Nml Nml Nml Nml N/A  Right PronatorTeres Nml Nml Nml 1+ 2- Rapid Some 1+ Some 1+ Nml Nml N/A  Right ABD Dig Min Nml Nml 1+ Nml SMU Rapid All 1+ All 1+ All 1+ ATR  Right Lumbo Parasp Low Nml Nml Nml Nml NE - - - - - - - N/A  Right FlexPolLong Nml Nml Nml Nml 1- Mod-R Few 1+ Few 1+ Nml Nml N/A  Right Flex Dig Long Nml Nml Nml Nml 1- Mod-R Few 1+ Few 1+ Nml Nml N/A  Right Mentalis 1+ Nml Nml Nml 1- Mod-R Few 1+ Few 1+ Nml Nml N/A  Right Hyoglossus Nml Nml Nml Nml Nml Nml Nml Nml Nml Nml Nml Nml N/A  Right Massater Nml Nml Nml Nml 1- Mod-R Few 1+ Few 1+ Nml Nml N/A  Waveforms:

## 2014-05-15 ENCOUNTER — Ambulatory Visit: Payer: Managed Care, Other (non HMO) | Attending: Neurology | Admitting: Physical Therapy

## 2014-05-15 DIAGNOSIS — M6281 Muscle weakness (generalized): Secondary | ICD-10-CM | POA: Insufficient documentation

## 2014-05-15 DIAGNOSIS — IMO0001 Reserved for inherently not codable concepts without codable children: Secondary | ICD-10-CM | POA: Diagnosis present

## 2014-05-15 DIAGNOSIS — R269 Unspecified abnormalities of gait and mobility: Secondary | ICD-10-CM | POA: Diagnosis not present

## 2014-05-16 ENCOUNTER — Other Ambulatory Visit: Payer: Managed Care, Other (non HMO)

## 2014-05-21 ENCOUNTER — Ambulatory Visit: Payer: Managed Care, Other (non HMO) | Admitting: Neurology

## 2014-05-22 ENCOUNTER — Telehealth: Payer: Self-pay | Admitting: *Deleted

## 2014-05-22 NOTE — Telephone Encounter (Signed)
Patient's wife called me back and I informed her that they had an appointment yesterday to go over results and they missed it.  Dr. Allena Katz would rather go over these results at an appointment.  Will try to get patient an appointment soon.

## 2014-05-22 NOTE — Telephone Encounter (Signed)
Left message for patients wife to call me back.

## 2014-05-22 NOTE — Telephone Encounter (Signed)
Patients wife called to get his results  Call back # [phone]

## 2014-05-23 ENCOUNTER — Encounter: Payer: Self-pay | Admitting: Neurology

## 2014-05-23 ENCOUNTER — Ambulatory Visit (INDEPENDENT_AMBULATORY_CARE_PROVIDER_SITE_OTHER): Payer: Managed Care, Other (non HMO) | Admitting: Neurology

## 2014-05-23 VITALS — BP 120/84 | HR 80 | Ht 69.0 in | Wt 200.5 lb

## 2014-05-23 DIAGNOSIS — G1221 Amyotrophic lateral sclerosis: Secondary | ICD-10-CM

## 2014-05-23 DIAGNOSIS — G1220 Motor neuron disease, unspecified: Secondary | ICD-10-CM

## 2014-05-23 DIAGNOSIS — R252 Cramp and spasm: Secondary | ICD-10-CM

## 2014-05-23 DIAGNOSIS — R748 Abnormal levels of other serum enzymes: Secondary | ICD-10-CM

## 2014-05-23 MED ORDER — GABAPENTIN 300 MG PO CAPS: 300.0000 mg | ORAL_CAPSULE | Freq: Every day | ORAL | Status: AC

## 2014-05-23 NOTE — Progress Notes (Signed)
Follow-up Visit   Date: 05/23/2014    James Munoz MRN: [id number] DOB: 08/18/1961   Interim History: James Munoz is a 53 y.o. right-handed Caucasian male with history of exercise-induced asthma (diagnoesd 2014), OSA on CPAP, and right CTS s/p release with redo (2013) and R ulnar transposition returning to the clinic for follow-up of orthopnea and generalized weakness.  The patient was accompanied to the clinic by wife who also provides collateral information.    History of present illness (Initial visit 05/02/2014): Starting in 2013, he developed slow onset of shortness of breath, especially with exertion. His shortness of breath is much worse when laying flat. He is unable to sleep on his back, even propped up on several pillows, so sleep on his side. Even laying back in a recliner causes orthopnea. He saw his PCP who started trial of inhalers, but there was no improvement so was referred to cardiology. His cardiac evaluation included echocardiogram and cardiac stress test and returned normal. Because of problems with snoring, he was evaluted by Dr. Vickey Huger. 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 He was given solumedrol  IV x 2 and subsequently started on prednisone  daily for month until he was seen by Dr. Dareen Piano, rheumology, at the end of 2014 who did not find evidence of polymyositis so tapered him off steroids.   Since early 2015, he has started developing painless muscle twitches of his arms and chest. Over the past several months, he has also noticed weakness of his arms and hands. He has difficulty with fine motor movements such as fastening buttons, pulling up zips, and feeding himself. He is dropping things more than usual. He also has noted mild difficulty climbing stairs. He endorses pain muscle cramps especially of his legs and hands. He denies any dark or tea-colored urine. He has no problems with  swallowing or talking.   Mood is good. Appetite is good. He has lost about 10 pounds over the past 6 months and noticed his muscles have become smaller.   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  UPDATE 05/23/2014:  He has noticed interval weakness of his right leg and finds himself stumbling at times.  He had no interval falls.  Denies problems with swallowing or talking. Weakness of his arms is unchanged. 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  There was no evidence of myopathy.  At his last visit, I checked his CK was surprisingly quite elevated at 2366 and aldolase was 31.  Other labs including Lyme, SPEP/UPEP and TSH returned normal.   Medications:  Current Outpatient Prescriptions on File Prior to Visit  Medication Sig Dispense Refill  . acetaminophen (TYLENOL) 325 MG tablet Take 650 mg by mouth every 6 (six) hours as needed.      Marland Kitchen albuterol (PROVENTIL HFA;VENTOLIN HFA) 108 (90 BASE) MCG/ACT inhaler Inhale into the lungs 2 (two) times daily.      . Cyanocobalamin (VITAMIN B-12 PO) Take by mouth.      . gabapentin (NEURONTIN) 100 MG capsule Take 1 capsule (100 mg total) by mouth at bedtime.  30 capsule  3  . ibuprofen (ADVIL,MOTRIN) 600 MG tablet       . Multiple Vitamin (MULTIVITAMIN) tablet Take 1 tablet by mouth daily.      Marland Kitchen  omeprazole (PRILOSEC) 40 MG capsule Take 1 capsule (40 mg total) by mouth at bedtime.  30 capsule  5  . Probiotic Product (PROBIOTIC DAILY PO) Take by mouth daily.      . SYMBICORT 80-4.5 MCG/ACT inhaler        No current facility-administered medications on file prior to visit.    Allergies: No Known Allergies   Review of Systems:  CONSTITUTIONAL: No fevers, chills, night sweats, +weight loss.   EYES: No visual changes or eye pain ENT: No  hearing changes.  No history of nose bleeds.   RESPIRATORY: No cough, wheezing +shortness of breath.   CARDIOVASCULAR: Negative for chest pain, and palpitations.   GI: Negative for abdominal discomfort, blood in stools or black stools.  No recent change in bowel habits.   GU:  No history of incontinence.   MUSCLOSKELETAL: No history of joint pain or swelling.  No myalgias.   SKIN: Negative for lesions, rash, and itching.   ENDOCRINE: Negative for cold or heat intolerance, polydipsia or goiter.   PSYCH:  No depression or anxiety symptoms.   NEURO: As Above.   Vital Signs:  BP 120/84  Pulse 80  Ht  (1.753 m)  Wt 200 lb 8 oz (90.946 kg)  BMI 29.60 kg/m2  SpO2 95%  General: Well appearing, notable atrophy of intrinsic hand muscles, comfortable.  Eyes/ENT: see cranial nerve examination.  Neck: No masses appreciated. Full range of motion without tenderness. No carotid bruits.  Respiratory: Clear to auscultation, good air entry bilaterally. He is able to count to 26 on deep inhalation  Cardiac: Regular rate and rhythm, no murmur.    Neurological Exam: MENTAL STATUS including orientation to time, place, person, recent and remote memory, attention span and concentration, language, and fund of knowledge is normal.  Speech is not dysarthric.  He is able to enunciate lingual and guttural sounds without difficulty.   CRANIAL NERVES:  No visual field defects.  Pupils equal round and reactive to light.  Normal conjugate, extra-ocular eye movements in all directions of gaze.  No ptosis. Normal facial sensation.  Face is symmetric with intact facial muscles. Palate elevates symmetrically.  Tongue is midline.  Pathological facial reflexes are absent.  MOTOR: Significant atrophy of the intrinsic hand muscles bilaterally. Very active fasciculations of the proximal upper extremity, including infraspinatus, moderate fasciculations of the quadriceps bilaterally. No pronator drift. Tone is reduced in  the arm and increased in the legs.   Neck flexion 5/5  Neck extension 5/5  Right Upper Extremity:    Left Upper Extremity:    Deltoid  4/5   Deltoid  4/5   Biceps  5-/5   Biceps  5/5   Triceps  5/5   Triceps  5-/5   Infraspinatus  4/5   Infraspinatus  4/5   Medial pectoralis  4+/5   Medial pectoralis  4+/5   Wrist extensors  5-/5   Wrist extensors  5/5   Wrist flexors  5/5   Wrist flexors  5/5   Finger extensors  5/5   Finger extensors  5/5   Finger flexors  4+/5   Finger flexors  4+/5   Dorsal interossei  4/5   Dorsal interossei  4/5   Abductor pollicis  4/5   Abductor pollicis  4/5   Tone (Ashworth scale)  0   Tone (Ashworth scale)  0    Right Lower Extremity:    Left Lower Extremity:    Hip flexors  4/5  Hip flexors  4/5   Hip extensors  4+/5   Hip extensors  4+/5   Abductor  5/5   Abductor  5/5   Abductor  5/5   Adductor  5/5   Knee flexors  5/5   Knee flexors  5/5   Knee extensors  5/5   Knee extensors  5/5   Dorsiflexors  5-/5   Dorsiflexors  5/5   Plantarflexors  5/5   Plantarflexors  5/5   Toe extensors  5-/5   Toe extensors  5/5   Toe flexors  5/5   Toe flexors  5/5   Tone (Ashworth scale)  1   Tone (Ashworth scale)  1    MSRs:  Right      Left  brachioradialis  2+   brachioradialis  3+   biceps  1+   biceps  3+   triceps  2+   triceps  2+   patellar  2+   patellar  2+   ankle jerk  1+   ankle jerk  2+   Hoffman  no   Hoffman  no   plantar response  down   plantar response  down    COORDINATION/GAIT:  Able to rise from a chair without using arms. Gait slightly wide-based and slow and cautious. He is unable to heel or toe walk on the right.    Data: EMG 05/14/2014  Impression:  1. 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  2. 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 3. There is no definite evidence of a generalized myopathy or peripheral neuropathy.  Labs 05/07/2014:  Lyme neg, CK 2366*, aldolase 31.7*, TSH 2.1, copper 104, SPEP/UPEP with IFE  Labs 07/12/2013: Acetylcholine receptor binding antibody negative, anti-Jo negative, CK 1241*  EMG 08/06/2012: Bilateral carpal tunnel syndrome, moderate to severe in degree electrically and worse on the right. Mild ulnar neuropathy at the elbow.   Cardiopulmonary exercise test 09/03/2013:  Exercise testing with gas exchange demonstrates a normal functional capacity when compared to matched sedentary norms. There was a clear ventilatory limitation the exercise, with an overall borderline restrictive response. Post-exercise spirometry was suggestive of an EIB response. There was no clear circulatory limitations to the exercise.  IMPRESSION: Mr. Norberto is a 53 year-old returning for evaluation of progressive shortness of breath, generalized weakness, and muscle atrophy. There is evidence of upper (increased tone, asymmetrically left hypperreflexia) and lower motor neuron (active fasciculations, weakness) findings on exam.   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  There was no evidence of myopathy on his electrodiagnostic testing. Because of this inconsistency, other possibilities including inclusion body myositis needs to be considered, and muscle biopsy may be the next step. I also offered a second opinion at [facility] which patient is interested in.  I had an extensive discussion regarding the potential possibilities including motor neuron disease such as ALS or inclusion body myositis. I do not feel that his clinical findings are suggestive of inflammatory myositis especially since  he has already completed a course of corticosteroids without any symptomatic benefit.   For completeness and  high clinical suspicion for motor neuron disease, I will order MRI brain and cervical spine to look for structural abnormalities.   PLAN/RECOMMENDATIONS:  1.  For muscle cramps, start taking neurontin  at bedtime x 2 days, then increase to  at bedtime.  He has tried tonic water and Mg in the past 2.  MRI brain and cervical spine  3.  Continue occupational and physical therapy 4.  If imaging is nondiagnostic, I will refer him to Neuromuscular Department at [facility] for a second opinion 5.  Return to clinic in 26-month   The duration of this appointment visit was 60 minutes of face-to-face time with the patient.  Greater than 50% of this time was spent in counseling, explanation of diagnosis, planning of further management, and coordination of care.   Thank you for allowing me to participate in patient's care.  If I can answer any additional questions, I would be pleased to do so.    Sincerely,    Donika K. Allena Katz, DO

## 2014-05-23 NOTE — Patient Instructions (Addendum)
1.  Start taking neurontin  at bedtime x 2 days, then increase to  at bedtime 2.  MRI brain and cervical spine as scheduled 3.  Continue occupational and physical therapy 4.  I will call you with the results of your imaging 5.  Return to clinic in October at scheduled appointment

## 2014-05-27 ENCOUNTER — Telehealth: Payer: Self-pay | Admitting: Neurology

## 2014-05-27 NOTE — Telephone Encounter (Signed)
Patient has a hard time being flat on his back without his CPAP machine.  Please advise with any other suggestions.

## 2014-05-27 NOTE — Telephone Encounter (Signed)
Let's make MRI without contrast.  This will shorten the time involved.  Please notify patient that I do not feel strongly that contrast is needed.  Thanks, Mandrell Vangilder K. Allena Katz, DO

## 2014-05-27 NOTE — Telephone Encounter (Signed)
Pt wife needs to talk to someone about her husbands MRI on sat please call [phone]

## 2014-05-27 NOTE — Telephone Encounter (Signed)
Can you call [facility] and see if they have upright MRI or inquire if they know of any place that does?  I don't think he will be able to lay flat long enough for the MRI either, so if we cant find upright MRI, may need to cancel.  Rubye Strohmeyer K. Allena Katz, DO

## 2014-05-27 NOTE — Telephone Encounter (Signed)
I called and they do not have open MRI.  The tech said we could do it without contrast which would be a shorter amount of time.  Dr. Allena Katz suggested CT instead.  Called Mrs. Ohms back to let her know and she said Mr. Allred decided to leave the test as is.

## 2014-05-27 NOTE — Telephone Encounter (Signed)
Test changed to without contrast.  Patient's wife notified.

## 2014-05-28 ENCOUNTER — Other Ambulatory Visit: Payer: Managed Care, Other (non HMO)

## 2014-05-28 ENCOUNTER — Encounter: Payer: Self-pay | Admitting: Neurology

## 2014-05-31 ENCOUNTER — Ambulatory Visit
Admission: RE | Admit: 2014-05-31 | Discharge: 2014-05-31 | Disposition: A | Discharge status: Home / Self Care | Payer: Managed Care, Other (non HMO) | Source: Ambulatory Visit | Attending: Neurology | Admitting: Neurology

## 2014-05-31 ENCOUNTER — Ambulatory Visit: Admission: RE | Admit: 2014-05-31 | Payer: Managed Care, Other (non HMO) | Source: Ambulatory Visit

## 2014-05-31 ENCOUNTER — Other Ambulatory Visit: Payer: Self-pay | Admitting: Neurology

## 2014-05-31 DIAGNOSIS — R748 Abnormal levels of other serum enzymes: Secondary | ICD-10-CM

## 2014-05-31 DIAGNOSIS — M6289 Other specified disorders of muscle: Secondary | ICD-10-CM

## 2014-05-31 DIAGNOSIS — G1221 Amyotrophic lateral sclerosis: Secondary | ICD-10-CM

## 2014-05-31 DIAGNOSIS — R252 Cramp and spasm: Secondary | ICD-10-CM

## 2014-05-31 DIAGNOSIS — G1229 Other motor neuron disease: Secondary | ICD-10-CM

## 2014-05-31 DIAGNOSIS — R531 Weakness: Secondary | ICD-10-CM

## 2014-06-02 ENCOUNTER — Ambulatory Visit: Payer: Managed Care, Other (non HMO) | Attending: Neurology | Admitting: Occupational Therapy

## 2014-06-02 ENCOUNTER — Telehealth: Payer: Self-pay | Admitting: Neurology

## 2014-06-02 DIAGNOSIS — IMO0001 Reserved for inherently not codable concepts without codable children: Secondary | ICD-10-CM | POA: Diagnosis present

## 2014-06-02 DIAGNOSIS — M6281 Muscle weakness (generalized): Secondary | ICD-10-CM | POA: Insufficient documentation

## 2014-06-02 DIAGNOSIS — R269 Unspecified abnormalities of gait and mobility: Secondary | ICD-10-CM | POA: Diagnosis not present

## 2014-06-02 MED ORDER — MELOXICAM 7.5 MG PO TABS: 7.5000 mg | ORAL_TABLET | Freq: Every day | ORAL | Status: AC | PRN

## 2014-06-02 NOTE — Telephone Encounter (Signed)
Called and discussed results of MRI brain and cervical spine which does not show any structural problems.  Will send referral to [facility] for second opinion for MND and hyperCKemia.   He is also complaining of leg pain for which I we can try Mobic.  Deni Lefever K. Allena Katz, DO

## 2014-06-03 ENCOUNTER — Ambulatory Visit: Payer: Managed Care, Other (non HMO) | Admitting: Physical Therapy

## 2014-06-03 ENCOUNTER — Telehealth: Payer: Self-pay | Admitting: Neurology

## 2014-06-03 NOTE — Telephone Encounter (Signed)
Pt's spouse called requesting to speak to a nurse regarding pt's disability. Also wanted to inform that pt's insurance is in network with [facility] and the center in [place]. Maybe he can be seen sooner in either one of those facilities.  C/B [phone]

## 2014-06-03 NOTE — Telephone Encounter (Signed)
I spoke with James Munoz and informed her that we can start paperwork whenever she brings it in.  Sent referral to Dr. Zannie Kehr and instructed James Munoz to call his assistant tomorrow to set up appointment.   [phone]

## 2014-06-03 NOTE — Telephone Encounter (Signed)
See next note

## 2014-06-03 NOTE — Telephone Encounter (Signed)
Yes, we can start to complete paperwork for him.  Donika K. Allena Katz, DO

## 2014-06-03 NOTE — Telephone Encounter (Signed)
James Munoz was wondering if she should go ahead and get started with the patient's disability paper work?  I told her that I am going to call [facility] this afternoon for an appointment and if it is not soon enough then I will call [facility].

## 2014-06-05 ENCOUNTER — Other Ambulatory Visit: Payer: Self-pay | Admitting: *Deleted

## 2014-06-05 ENCOUNTER — Telehealth: Payer: Self-pay | Admitting: Neurology

## 2014-06-05 MED ORDER — DICLOFENAC SODIUM 50 MG PO TBEC: 50.0000 mg | DELAYED_RELEASE_TABLET | Freq: Two times a day (BID) | ORAL | Status: AC

## 2014-06-05 NOTE — Telephone Encounter (Signed)
Pt's spouse called requesting to speak to a nurse regarding pt's pain. She states that the meds are not working and Wants to know what else can be done.  C/B [phone]

## 2014-06-05 NOTE — Telephone Encounter (Signed)
Spoke with Mrs. Roseanne Reno and she said that the pain in Mr. Yellin's leg is really bad and nothing is helping.  Informed her that we would try the Diclofenac per Dr. Allena Katz.  She agreed with plan.  Rx sent in.

## 2014-06-05 NOTE — Telephone Encounter (Signed)
Left message for Mrs. Alviar to call me back.  Will inform her that Dr. Allena Katz has recommended Diclofenac 50 mg bid.

## 2014-06-05 NOTE — Telephone Encounter (Signed)
C/B [phone]

## 2014-06-06 ENCOUNTER — Telehealth: Payer: Self-pay | Admitting: Neurology

## 2014-06-06 NOTE — Telephone Encounter (Signed)
Pt needs to talk to someone about [facility] referral please call pt wife and let her know the status [phone]

## 2014-06-09 NOTE — Telephone Encounter (Signed)
Called Mrs. James Munoz and she said that she called for an appointment last week and still has not heard back from [facility].  I called [facility] and they said they will be scheduling the patient this week and will call them.  Mrs. James Munoz notified.

## 2014-06-10 ENCOUNTER — Telehealth: Payer: Self-pay | Admitting: *Deleted

## 2014-06-10 ENCOUNTER — Ambulatory Visit: Payer: Managed Care, Other (non HMO) | Admitting: Physical Therapy

## 2014-06-10 DIAGNOSIS — IMO0001 Reserved for inherently not codable concepts without codable children: Secondary | ICD-10-CM | POA: Diagnosis not present

## 2014-06-10 NOTE — Telephone Encounter (Signed)
Patient's wife notified ok for flu shot.

## 2014-06-10 NOTE — Telephone Encounter (Signed)
Please advise 

## 2014-06-10 NOTE — Telephone Encounter (Signed)
Is it ok for patient to receive a flu vaccine  Call back# [phone]

## 2014-06-10 NOTE — Telephone Encounter (Signed)
Yes, ok to get flu vaccine.  Esraa Seres K. Allena Katz, DO

## 2014-06-11 ENCOUNTER — Telehealth: Payer: Self-pay | Admitting: *Deleted

## 2014-06-11 NOTE — Telephone Encounter (Signed)
Please follow up with [facility] to see what the hold up is  Call back # [phone] or [phone]

## 2014-06-12 NOTE — Telephone Encounter (Signed)
Spoke with patient's wife and [facility] has still not called them for an appointment.

## 2014-06-12 NOTE — Telephone Encounter (Signed)
Called [facility] and they said that appointment was set up for Dec. 1 at 8:30 and EMG at 1:00.   Appointment was made today at 1:44.  Mrs. James Munoz notified.  I sent a referral to [facility] but they do not take patient's insurance.  I will try [facility] and [employer].

## 2014-06-12 NOTE — Telephone Encounter (Signed)
[name] from [facility] called and said that they should take patient's insurance.  She will call patient's wife and discuss getting patient seen there.

## 2014-06-16 ENCOUNTER — Ambulatory Visit: Payer: Managed Care, Other (non HMO) | Admitting: Neurology

## 2014-06-16 ENCOUNTER — Ambulatory Visit: Payer: Managed Care, Other (non HMO)

## 2014-06-16 ENCOUNTER — Ambulatory Visit: Payer: Managed Care, Other (non HMO) | Admitting: Occupational Therapy

## 2014-06-17 ENCOUNTER — Telehealth: Payer: Self-pay | Admitting: Neurology

## 2014-06-17 ENCOUNTER — Ambulatory Visit: Payer: Managed Care, Other (non HMO) | Admitting: Physical Therapy

## 2014-06-17 ENCOUNTER — Encounter: Payer: Managed Care, Other (non HMO) | Admitting: Occupational Therapy

## 2014-06-17 ENCOUNTER — Ambulatory Visit: Payer: Managed Care, Other (non HMO) | Admitting: Occupational Therapy

## 2014-06-17 DIAGNOSIS — IMO0001 Reserved for inherently not codable concepts without codable children: Secondary | ICD-10-CM | POA: Diagnosis not present

## 2014-06-17 NOTE — Telephone Encounter (Signed)
[name], Pt's wife called f/u on the referral. She states that she called his insurance and found out that [facility] in network with [facility] insurance. Pt is scheduled to be seen on 06/30/14. Call [name] if you have any questions.  C/B [phone]

## 2014-06-17 NOTE — Telephone Encounter (Signed)
Informed [name] that I will cancel his appointment with Dr. Allena KatzPatel on Oct. 3.

## 2014-06-20 ENCOUNTER — Ambulatory Visit: Payer: Managed Care, Other (non HMO) | Admitting: Neurology

## 2014-06-23 ENCOUNTER — Ambulatory Visit: Payer: Managed Care, Other (non HMO) | Admitting: Physical Therapy

## 2014-06-23 ENCOUNTER — Ambulatory Visit: Payer: Managed Care, Other (non HMO) | Attending: Neurology | Admitting: Occupational Therapy

## 2014-06-23 DIAGNOSIS — M6281 Muscle weakness (generalized): Secondary | ICD-10-CM | POA: Insufficient documentation

## 2014-06-23 DIAGNOSIS — R269 Unspecified abnormalities of gait and mobility: Secondary | ICD-10-CM | POA: Insufficient documentation

## 2014-06-23 DIAGNOSIS — Z5189 Encounter for other specified aftercare: Secondary | ICD-10-CM | POA: Insufficient documentation

## 2014-07-01 ENCOUNTER — Ambulatory Visit: Payer: Managed Care, Other (non HMO) | Admitting: Physical Therapy

## 2014-07-01 ENCOUNTER — Encounter: Payer: Managed Care, Other (non HMO) | Admitting: Occupational Therapy

## 2014-07-03 ENCOUNTER — Encounter: Payer: Managed Care, Other (non HMO) | Admitting: Neurology

## 2014-07-08 ENCOUNTER — Ambulatory Visit: Payer: Managed Care, Other (non HMO) | Admitting: Physical Therapy

## 2014-07-08 ENCOUNTER — Encounter: Payer: Managed Care, Other (non HMO) | Admitting: Occupational Therapy

## 2014-07-15 ENCOUNTER — Encounter: Payer: Managed Care, Other (non HMO) | Admitting: Occupational Therapy

## 2014-07-15 ENCOUNTER — Ambulatory Visit: Payer: Managed Care, Other (non HMO)

## 2014-07-15 ENCOUNTER — Telehealth: Payer: Self-pay | Admitting: Neurology

## 2014-07-15 NOTE — Telephone Encounter (Signed)
[facility] in December but in the meantime [facility]. Per wife, [facility]. Wife wants to confirm that [facility] appt is gets cancelled. Please call wife, Eunice BlaseDebbie, @[phone] / Roanna RaiderSherri S>

## 2014-07-15 NOTE — Telephone Encounter (Signed)
Spoke with Eunice Blaseebbie and informed her I will cancel the [facility] appointment.

## 2014-07-17 NOTE — Telephone Encounter (Signed)
[facility] appointment cancelled.

## 2014-07-24 ENCOUNTER — Ambulatory Visit: Payer: Managed Care, Other (non HMO) | Admitting: Physical Therapy

## 2014-07-24 ENCOUNTER — Encounter: Payer: Managed Care, Other (non HMO) | Admitting: Occupational Therapy

## 2014-07-29 ENCOUNTER — Ambulatory Visit: Payer: Managed Care, Other (non HMO) | Admitting: Physical Therapy

## 2014-07-29 ENCOUNTER — Encounter: Payer: Managed Care, Other (non HMO) | Admitting: Occupational Therapy

## 2014-08-05 ENCOUNTER — Ambulatory Visit: Payer: Managed Care, Other (non HMO) | Admitting: Physical Therapy

## 2014-08-05 ENCOUNTER — Encounter: Payer: Managed Care, Other (non HMO) | Admitting: Occupational Therapy

## 2015-07-16 NOTE — Telephone Encounter (Signed)
Error

## 2016-02-25 ENCOUNTER — Emergency Department (HOSPITAL_COMMUNITY): Payer: Medicare Other

## 2016-02-25 ENCOUNTER — Emergency Department (HOSPITAL_COMMUNITY)
Admission: EM | Admit: 2016-02-25 | Discharge: 2016-02-26 | Disposition: A | Discharge status: Home / Self Care | Payer: Medicare Other | Attending: Emergency Medicine | Admitting: Emergency Medicine

## 2016-02-25 ENCOUNTER — Encounter (HOSPITAL_COMMUNITY): Payer: Self-pay | Admitting: Emergency Medicine

## 2016-02-25 DIAGNOSIS — R0600 Dyspnea, unspecified: Secondary | ICD-10-CM | POA: Insufficient documentation

## 2016-02-25 DIAGNOSIS — R0609 Other forms of dyspnea: Secondary | ICD-10-CM

## 2016-02-25 DIAGNOSIS — R0602 Shortness of breath: Secondary | ICD-10-CM | POA: Diagnosis present

## 2016-02-25 DIAGNOSIS — Z8669 Personal history of other diseases of the nervous system and sense organs: Secondary | ICD-10-CM | POA: Insufficient documentation

## 2016-02-25 DIAGNOSIS — Z8739 Personal history of other diseases of the musculoskeletal system and connective tissue: Secondary | ICD-10-CM | POA: Insufficient documentation

## 2016-02-25 DIAGNOSIS — Z791 Long term (current) use of non-steroidal anti-inflammatories (NSAID): Secondary | ICD-10-CM | POA: Insufficient documentation

## 2016-02-25 DIAGNOSIS — Z87891 Personal history of nicotine dependence: Secondary | ICD-10-CM | POA: Diagnosis not present

## 2016-02-25 DIAGNOSIS — Z79899 Other long term (current) drug therapy: Secondary | ICD-10-CM | POA: Insufficient documentation

## 2016-02-25 DIAGNOSIS — J45901 Unspecified asthma with (acute) exacerbation: Secondary | ICD-10-CM | POA: Diagnosis not present

## 2016-02-25 HISTORY — DX: Amyotrophic lateral sclerosis: G12.21

## 2016-02-25 LAB — I-STAT TROPONIN, ED: Troponin i, poc: 0 ng/mL (ref 0.00–0.08)

## 2016-02-25 LAB — BASIC METABOLIC PANEL
Anion gap: 12 (ref 5–15)
BUN: 12 mg/dL (ref 6–20)
CO2: 26 mmol/L (ref 22–32)
CREATININE: 0.79 mg/dL (ref 0.61–1.24)
Calcium: 10 mg/dL (ref 8.9–10.3)
Chloride: 100 mmol/L — ABNORMAL LOW (ref 101–111)
Glucose, Bld: 125 mg/dL — ABNORMAL HIGH (ref 65–99)
POTASSIUM: 4.1 mmol/L (ref 3.5–5.1)
SODIUM: 138 mmol/L (ref 135–145)

## 2016-02-25 LAB — CBC
HCT: 49 % (ref 39.0–52.0)
Hemoglobin: 16.5 g/dL (ref 13.0–17.0)
MCH: 30.5 pg (ref 26.0–34.0)
MCHC: 33.7 g/dL (ref 30.0–36.0)
MCV: 90.6 fL (ref 78.0–100.0)
PLATELETS: 229 10*3/uL (ref 150–400)
RBC: 5.41 MIL/uL (ref 4.22–5.81)
RDW: 13.5 % (ref 11.5–15.5)
WBC: 17.3 10*3/uL — AB (ref 4.0–10.5)

## 2016-02-25 MED ORDER — ALBUTEROL SULFATE (2.5 MG/3ML) 0.083% IN NEBU: 5.0000 mg | INHALATION_SOLUTION | Freq: Once | RESPIRATORY_TRACT | Status: DC

## 2016-02-25 NOTE — ED Notes (Signed)
The patient was diagnosed with ALS in 2015.  His disease has gotten progressively worse.  He advised his diaphragm is not "working" like it used to.  He says today he became SOB with exertion.  He says is now able to catch his breath but was not able to earlier.  He advised he uses bipap at night and laid down first with the bipap to see if it helped but it did not.  He is here to be evaluated.

## 2016-02-25 NOTE — ED Notes (Addendum)
Pt sitting up talking no acute distress noted. Pt states all symptoms from episode earlier today have resolved. Pt concerned his ALS is worsening.

## 2016-02-26 LAB — DIFFERENTIAL
BASOS PCT: 0 %
Basophils Absolute: 0 10*3/uL (ref 0.0–0.1)
EOS ABS: 0.1 10*3/uL (ref 0.0–0.7)
EOS PCT: 1 %
Lymphocytes Relative: 12 %
Lymphs Abs: 2 10*3/uL (ref 0.7–4.0)
MONO ABS: 0.8 10*3/uL (ref 0.1–1.0)
MONOS PCT: 5 %
Neutro Abs: 14.7 10*3/uL — ABNORMAL HIGH (ref 1.7–7.7)
Neutrophils Relative %: 82 %

## 2016-02-26 MED ORDER — SODIUM CHLORIDE 0.9 % IV BOLUS (SEPSIS)
1000.0000 mL | Freq: Once | INTRAVENOUS | Status: AC
  Administered 2016-02-26: 1000 mL via INTRAVENOUS

## 2016-02-26 NOTE — ED Provider Notes (Signed)
CSN: [id number]     Arrival date & time 02/25/16  1948 History   First MD Initiated Contact with Patient 02/25/16 2353     Chief Complaint  Patient presents with  . Shortness of Breath    The patient was diagnosed with ALS in 2015.  His disease has gotten progressively worse.  He advised his diaphragm is not "working" like it used to.  He says today he became SOB with exertion.  He says is now able to catch his breath but was not able to earlier.     (Consider location/radiation/quality/duration/timing/severity/associated sxs/prior Treatment) HPI Comments: Patient with a history of ALS presents with SOB that started today after his afternoon exercise. He has a bipap at home which he used and became asymptomatic. No cough or fever. He and his wife were concerned that this represented progressive disease. He remains asymptomatic in the ED. No chest pain, nausea or vomiting.   Patient is a 55 y.o. male presenting with shortness of breath. The history is provided by the patient and the spouse. No language interpreter was used.  Shortness of Breath Associated symptoms: no cough and no fever     Past Medical History  Diagnosis Date  . Dental bridge present     lower  . Snores     denies apnea or waking up gasping/choking  . Carpal tunnel syndrome of right wrist 03/2012    weakness and decreased motor skills right hand  . Snoring disorder 07/12/2013  . 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 Asthma   . ALS (amyotrophic lateral sclerosis) [facility])    Past Surgical History  Procedure Laterality Date  . Appendectomy    . Carpal tunnel release  04/06/2012    Procedure: CARPAL TUNNEL RELEASE;  Surgeon: Wyn Forster., MD;  Location: [facility];  Service: Orthopedics;  Laterality: Right;  . Tendon exploration  08/21/2012    Procedure: TENDON EXPLORATION;  Surgeon: Wyn Forster., MD;  Location: [facility];  Service: Orthopedics;  Laterality: Right;  Wide  Exploration of Carpal Canal , Cyst Excision Right    . Ulnar nerve transposition  08/21/2012    Procedure: ULNAR NERVE DECOMPRESSION/TRANSPOSITION;  Surgeon: Wyn Forster., MD;  Location: [facility];  Service: Orthopedics;  Laterality: Right;  Decompression Ulnar Nerve     Family History  Problem Relation Age of Onset  . CAD Father     Deceased, 51  . Other Mother     Living  . Arthritis Mother   . Hepatitis C Sister   . Epilepsy Son   . Healthy Son    Social History  Substance Use Topics  . Smoking status: Former Smoker -- 1.00 packs/day for 8 years    Types: Cigarettes    Quit date: 08/08/1983  . Smokeless tobacco: Never Used     Comment: Pt quit in 1980  . Alcohol Use: No    Review of Systems  Constitutional: Negative for fever and chills.  HENT: Negative.   Respiratory: Positive for shortness of breath. Negative for cough.   Cardiovascular: Negative.   Gastrointestinal: Negative.  Negative for nausea.  Musculoskeletal: Negative.  Negative for myalgias.  Skin: Negative.   Neurological: Negative.       Allergies  Review of patient's allergies indicates no known allergies.  Home Medications   Prior to Admission medications   Medication Sig Start Date End Date Taking? Authorizing Provider  acetaminophen (TYLENOL) 325 MG tablet Take  650 mg by mouth every 6 (six) hours as needed.    Historical Provider, MD  albuterol (PROVENTIL HFA;VENTOLIN HFA) 108 (90 BASE) MCG/ACT inhaler Inhale into the lungs 2 (two) times daily.    Historical Provider, MD  Cyanocobalamin (VITAMIN B-12 PO) Take by mouth.    Historical Provider, MD  diclofenac (VOLTAREN) 50 MG EC tablet Take 1 tablet (50 mg total) by mouth 2 (two) times daily. 06/05/14   Donika Concha Se, DO  gabapentin (NEURONTIN) 300 MG capsule Take 1 capsule (300 mg total) by mouth at bedtime. 05/23/14   Glendale Chard, DO  ibuprofen (ADVIL,MOTRIN) 600 MG tablet  01/23/14   Historical Provider, MD  meloxicam  (MOBIC) 7.5 MG tablet Take 1 tablet (7.5 mg total) by mouth daily as needed for pain. 06/02/14   Glendale Chard, DO  Multiple Vitamin (MULTIVITAMIN) tablet Take 1 tablet by mouth daily.    Historical Provider, MD  omeprazole (PRILOSEC) 40 MG capsule Take 1 capsule (40 mg total) by mouth at bedtime. 08/14/13   Mihai Croitoru, MD  Probiotic Product (PROBIOTIC DAILY PO) Take by mouth daily.    Historical Provider, MD  SYMBICORT 80-4.5 MCG/ACT inhaler  10/30/13   Historical Provider, MD   BP 110/77 mmHg  Pulse 77  Temp(Src) 97.9 F (36.6 C) (Oral)  Resp 18  Ht  (1.753 m)  Wt 95.255 kg  BMI 31.00 kg/m2  SpO2 95% Physical Exam  Constitutional: He is oriented to person, place, and time. He appears well-developed and well-nourished.  HENT:  Head: Normocephalic.  Neck: Normal range of motion. Neck supple.  Cardiovascular: Normal rate and regular rhythm.   Pulmonary/Chest: Effort normal and breath sounds normal. He has no wheezes. He has no rales.  Full breath sounds to all fields  Abdominal: Soft. Bowel sounds are normal. There is no tenderness. There is no rebound and no guarding.  Musculoskeletal: Normal range of motion.  Neurological: He is alert and oriented to person, place, and time.  Skin: Skin is warm and dry. No rash noted.  Psychiatric: He has a normal mood and affect.    ED Course  Procedures (including critical care time) Labs Review Labs Reviewed  BASIC METABOLIC PANEL - Abnormal; Notable for the following:    Chloride 100 (*)    Glucose, Bld 125 (*)    All other components within normal limits  CBC - Abnormal; Notable for the following:    WBC 17.3 (*)    All other components within normal limits  DIFFERENTIAL - Abnormal; Notable for the following:    Neutro Abs 14.7 (*)    All other components within normal limits  I-STAT TROPOININ, ED    Imaging Review Dg Chest 2 View  02/25/2016  CLINICAL DATA:  55 year old male with shortness of breath EXAM: CHEST  2 VIEW  COMPARISON:  Chest CT dated 08/09/2013 FINDINGS: Two views of the chest do not demonstrate a focal consolidation. Minimal bibasilar atelectasis/scarring. There is no pleural effusion or pneumothorax. The cardiac silhouette is within normal limits. Osteopenia. No acute osseous pathology. IMPRESSION: No active cardiopulmonary disease. Electronically Signed   By: Elgie Collard M.D.   On: 02/25/2016 20:48   I have personally reviewed and evaluated these images and lab results as part of my medical decision-making.   EKG Interpretation   Date/Time:  Thursday February 25 2016 19:56:37 EDT Ventricular Rate:  107 PR Interval:  172 QRS Duration: 86 QT Interval:  346 QTC Calculation: 461 R Axis:  52 Text Interpretation:  Sinus tachycardia Otherwise normal ECG No old  tracing to compare Confirmed by Person Memorial HospitalGLICK  MD, DAVID ([id number]) on 02/25/2016  8:02:10 PM      MDM   Final diagnoses:  None    1. DOE  The patient has ALS with rapid progressive symptoms. Tonight became SOB with his routine exercise, which he now does from wheelchair. He became asymptomatic after use of his BiPap at home but wanted to be reassured that no new process had caused his SOB out of proportion to what he is used to. The patient remains asymptomatic during evaluation. He can be discharged home with PCP follow up outpatient. DOE felt likely to be related to progressing deconditioning associated with ALS.  Elpidio AnisShari Filip Luten, PA-C 02/27/16 [phone]  Tomasita CrumbleAdeleke Oni, MD 02/27/16 [phone]

## 2016-02-26 NOTE — Discharge Instructions (Signed)

## 2016-02-26 NOTE — ED Notes (Signed)
Patient verbalized understanding of discharge instructions and denies any further needs or questions at this time. VS stable. This RN assisted patient to ED entrance in wheelchair and helped him into his vehicle. Explained to patient that PA-C placed home health order for patient in regards to his difficulties with ADLs at home - pt stated that he hadn't been drinking a lot of water lately because it is tiresome for him to get up and go to the restroom.

## 2020-08-15 ENCOUNTER — Emergency Department (HOSPITAL_COMMUNITY)
Admission: EM | Admit: 2020-08-15 | Discharge: 2020-08-15 | Disposition: A | Discharge status: Home / Self Care | Attending: Emergency Medicine | Admitting: Emergency Medicine

## 2020-08-15 ENCOUNTER — Encounter (HOSPITAL_COMMUNITY): Payer: Self-pay | Admitting: Emergency Medicine

## 2020-08-15 ENCOUNTER — Emergency Department (HOSPITAL_COMMUNITY)

## 2020-08-15 DIAGNOSIS — M21961 Unspecified acquired deformity of right lower leg: Secondary | ICD-10-CM

## 2020-08-15 DIAGNOSIS — Y92002 Bathroom of unspecified non-institutional (private) residence single-family (private) house as the place of occurrence of the external cause: Secondary | ICD-10-CM | POA: Diagnosis not present

## 2020-08-15 DIAGNOSIS — M21061 Valgus deformity, not elsewhere classified, right knee: Secondary | ICD-10-CM | POA: Insufficient documentation

## 2020-08-15 DIAGNOSIS — Z7952 Long term (current) use of systemic steroids: Secondary | ICD-10-CM | POA: Diagnosis not present

## 2020-08-15 DIAGNOSIS — W182XXA Fall in (into) shower or empty bathtub, initial encounter: Secondary | ICD-10-CM | POA: Insufficient documentation

## 2020-08-15 DIAGNOSIS — S8991XA Unspecified injury of right lower leg, initial encounter: Secondary | ICD-10-CM | POA: Diagnosis present

## 2020-08-15 DIAGNOSIS — S82831A Other fracture of upper and lower end of right fibula, initial encounter for closed fracture: Secondary | ICD-10-CM | POA: Insufficient documentation

## 2020-08-15 DIAGNOSIS — J45909 Unspecified asthma, uncomplicated: Secondary | ICD-10-CM | POA: Insufficient documentation

## 2020-08-15 DIAGNOSIS — S82101A Unspecified fracture of upper end of right tibia, initial encounter for closed fracture: Secondary | ICD-10-CM | POA: Insufficient documentation

## 2020-08-15 DIAGNOSIS — Z87891 Personal history of nicotine dependence: Secondary | ICD-10-CM | POA: Insufficient documentation

## 2020-08-15 MED ORDER — OXYCODONE-ACETAMINOPHEN 5-325 MG PO TABS
1.0000 | ORAL_TABLET | Freq: Once | ORAL | Status: AC
  Administered 2020-08-15: 1 via ORAL
  Filled 2020-08-15: qty 1

## 2020-08-15 MED ORDER — FENTANYL CITRATE (PF) 100 MCG/2ML IJ SOLN
50.0000 ug | Freq: Once | INTRAMUSCULAR | Status: AC
  Administered 2020-08-15: 50 ug via INTRAVENOUS
  Filled 2020-08-15: qty 2

## 2020-08-15 MED ORDER — ONDANSETRON HCL 4 MG/2ML IJ SOLN
4.0000 mg | Freq: Once | INTRAMUSCULAR | Status: AC
  Administered 2020-08-15: 4 mg via INTRAVENOUS
  Filled 2020-08-15: qty 2

## 2020-08-15 NOTE — ED Notes (Signed)
Ortho paged for long leg splint application.

## 2020-08-15 NOTE — ED Notes (Signed)
This RN contacted [facility] to provide report on patient prior to his transport to facility, staff advised they would have the nurse on call contact me in reference to getting report on patient.

## 2020-08-15 NOTE — ED Notes (Signed)
ED Provider at bedside. 

## 2020-08-15 NOTE — ED Notes (Signed)
PTAR at bedside 

## 2020-08-15 NOTE — ED Provider Notes (Signed)
[facility] EMERGENCY DEPARTMENT Provider Note   CSN: [id number] Arrival date & time: 08/15/20  8588     History Chief Complaint  Patient presents with  . Fall  . Leg Injury    James Munoz is a 59 y.o. male.  59 y/o male with hx of ALS (wheelchair bound) presents to the ED after falling in his bathroom. Wife assisted patient to toilet with [facility] lift. When he was finished, wife turned to use the lift and patient lost his balance falling forward off the toilet. Ended up in a ball on the floor with his legs buckled beneath him. Has no motor use of his lower extremities at baseline. Reports hearing a "pop" in his knee followed by onset of pain. Pain has remained constant and is worse with palpation and movement. Denies sensation changes. Received 150mg  Fentanyl PTA by EMS. Is not chronically anticoagulated.  The history is provided by the patient. No language interpreter was used.  Fall       Past Medical History:  Diagnosis Date  . ALS (amyotrophic lateral sclerosis) (HCC)   . Asthma   . Carpal tunnel syndrome of right wrist 03/2012   weakness and decreased motor skills right hand  . 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 Dental bridge present    lower  . Snores    denies apnea or waking up gasping/choking  . Snoring disorder 07/12/2013    Patient Active Problem List   Diagnosis Date Noted  . OSA on CPAP 11/12/2013  . Abnormal CK 08/17/2013  . Snoring disorder 07/12/2013  . 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 Bilateral neck pain 07/12/2013  . 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 Shortness of breath dyspnea 07/12/2013  . Other dyspnea and respiratory abnormality 07/09/2013  . Dyspnea 07/09/2013    Past Surgical History:  Procedure Laterality Date  . APPENDECTOMY    . CARPAL TUNNEL RELEASE  04/06/2012   Procedure: CARPAL TUNNEL RELEASE;  Surgeon: 04/08/2012., MD;  Location: [facility];  Service: Orthopedics;   Laterality: Right;  . TENDON EXPLORATION  08/21/2012   Procedure: TENDON EXPLORATION;  Surgeon: 14/11/2011., MD;  Location: [facility];  Service: Orthopedics;  Laterality: Right;  Wide Exploration of Carpal Canal , Cyst Excision Right    . ULNAR NERVE TRANSPOSITION  08/21/2012   Procedure: ULNAR NERVE DECOMPRESSION/TRANSPOSITION;  Surgeon: 14/11/2011., MD;  Location: [facility];  Service: Orthopedics;  Laterality: Right;  Decompression Ulnar Nerve         Family History  Problem Relation Age of Onset  . Other Mother        Living  . Arthritis Mother   . CAD Father        Deceased, 26  . Hepatitis C Sister   . Epilepsy Son   . Healthy Son     Social History   Tobacco Use  . Smoking status: Former Smoker    Packs/day: 1.00    Years: 8.00    Pack years: 8.00    Types: Cigarettes    Quit date: 08/08/1983    Years since quitting: 37.0  . Smokeless tobacco: Never Used  . Tobacco comment: Pt quit in 1980  Substance Use Topics  . Alcohol use: No  . Drug use: No    Home Medications Prior to Admission medications   Medication Sig Start Date End Date Taking? Authorizing Provider  acetaminophen (TYLENOL) 325 MG tablet Take  650 mg by mouth every 6 (six) hours as needed.    [provider]  albuterol (PROVENTIL HFA;VENTOLIN HFA) 108 (90 BASE) MCG/ACT inhaler Inhale into the lungs 2 (two) times daily.    [provider]  Cyanocobalamin (VITAMIN B-12 PO) Take by mouth.    [provider]  diclofenac (VOLTAREN) 50 MG EC tablet Take 1 tablet (50 mg total) by mouth 2 (two) times daily. 06/05/14   Patel, Roxana Hires K, DO  gabapentin (NEURONTIN) 300 MG capsule Take 1 capsule (300 mg total) by mouth at bedtime. 05/23/14   Nita Sickle K, DO  ibuprofen (ADVIL,MOTRIN) 600 MG tablet  01/23/14   [provider]  meloxicam (MOBIC) 7.5 MG tablet Take 1 tablet (7.5 mg total) by mouth daily as needed for pain. 06/02/14   Nita Sickle K, DO  Multiple Vitamin (MULTIVITAMIN) tablet Take 1 tablet by mouth daily.    [provider]  omeprazole (PRILOSEC) 40 MG capsule Take 1 capsule (40 mg total) by mouth at bedtime. 08/14/13   Croitoru, Mihai, MD  Probiotic Product (PROBIOTIC DAILY PO) Take by mouth daily.    [provider]  SYMBICORT 80-4.5 MCG/ACT inhaler  10/30/13   [provider]    Allergies    Patient has no known allergies.  Review of Systems   Review of Systems  Ten systems reviewed and are negative for acute change, except as noted in the HPI.    Physical Exam Updated Vital Signs BP 112/80   Pulse 70   Temp 98.2 F (36.8 C) (Axillary)   Resp 17   SpO2 99%   Physical Exam Vitals and nursing note reviewed.  Constitutional:      General: He is not in acute distress.    Appearance: He is well-developed. He is not diaphoretic.     Comments: Nontoxic appearing and in NAD  HENT:     Head: Normocephalic and atraumatic.  Eyes:     General: No scleral icterus.    Conjunctiva/sclera: Conjunctivae normal.  Cardiovascular:     Rate and Rhythm: Normal rate and regular rhythm.     Pulses: Normal pulses.     Comments: DP pulse 2+ in the RLE Pulmonary:     Effort: Pulmonary effort is normal. No respiratory distress.     Comments: Respirations even and unlabored Musculoskeletal:     Cervical back: Normal range of motion.     Right knee: Swelling, deformity and bony tenderness present. Decreased range of motion. Tenderness present.  Skin:    General: Skin is warm and dry.     Coloration: Skin is not pale.     Findings: No erythema or rash.  Neurological:     Mental Status: He is alert and oriented to person, place, and time.     Comments: Sensation to light touch intact in BLE. No motor abilities in BLE at baseline.  Psychiatric:        Behavior: Behavior normal.     ED Results / Procedures / Treatments   Labs (all labs ordered are listed, but only abnormal results  are displayed) Labs Reviewed  CBC WITH DIFFERENTIAL/PLATELET  BASIC METABOLIC PANEL    EKG None  Radiology DG Tibia/Fibula Right Port  Result Date: 08/15/2020 CLINICAL DATA:  Right lower leg deformity EXAM: PORTABLE RIGHT TIBIA AND FIBULA - 2 VIEW COMPARISON:  None. FINDINGS: Diffuse bone demineralization. Transverse fractures of the proximal metadiaphysis of the right tibia and fibula without displacement. Degenerative changes in the right  ankle. No focal bone lesions. IMPRESSION: Transverse nondisplaced fractures of the proximal metadiaphysis of the right tibia and fibula. Electronically Signed   By: Burman Nieves M.D.   On: 08/15/2020 06:01    Procedures Procedures (including critical care time)  Medications Ordered in ED Medications  oxyCODONE-acetaminophen (PERCOCET/ROXICET) 5-325 MG per tablet 1 tablet (has no administration in time range)  fentaNYL (SUBLIMAZE) injection 50 mcg (50 mcg Intravenous Given 08/15/20 0552)  ondansetron (ZOFRAN) injection 4 mg (4 mg Intravenous Given 08/15/20 0553)    ED Course  I have reviewed the triage vital signs and the nursing notes.  Pertinent labs & imaging results that were available during my care of the patient were reviewed by me and considered in my medical decision making (see chart for details).  Clinical Course as of Aug 16 723  Sat Aug 15, 2020  0618 Consult placed to Orthopedics to discuss management.   [KH]  5643 Dr. Victorino Dike to review images and call back.   [KH]  [phone] Dr. Victorino Dike has reviewed imaging. Fibular head without concern for dislocation. Fractures are nondisplaced.  Recommends long leg splint with stirrup components at knee. Will need to be non-weightbearing.    [KH]  [phone] Plan discussed with patient and family.  Given added resources that may be necessary to ensure appropriate management of fracture as outpatient, call placed to hospice to see about placement in respite care   [KH]  0722 May be possible for  patient to be discharged to hospice respite care.  Hospitalist to call back with on-call provider in the morning.  We will also check on respite bed availability.  Family updated.   [KH]    Clinical Course User Index [KH] Antony Madura, PA-C   MDM Rules/Calculators/A&P                          59 year old male with a history of ALS presents following a fall at home.  Sustained right tibia and fibular fracture.  Fracture fragments are nondisplaced.  He is neurovascularly intact.  Placed in a long-leg splint.  Will need to remain nonweightbearing.  Pending hospice assessment to assist in disposition. Care signed out to [facility], PA-C at shift change.   Final Clinical Impression(s) / ED Diagnoses Final diagnoses:  Deformity of knee joint, right  Closed fracture of proximal end of right tibia, unspecified fracture morphology, initial encounter  Closed fracture of proximal end of right fibula, unspecified fracture morphology, initial encounter    Rx / DC Orders ED Discharge Orders    None       Antony Madura, PA-C 08/15/20 0725    Gilda Crease, MD 08/16/20 [phone]

## 2020-08-15 NOTE — ED Notes (Signed)
Per PA Humes, patient to remain in ED for evaluation by Hospice provider doing rounds in the hospital for possible placement at hospice house.

## 2020-08-15 NOTE — Discharge Instructions (Signed)
Keep your splint on at all times. Do not put any weight on your right leg. Keep the leg supported when transitioning.

## 2020-08-15 NOTE — ED Triage Notes (Signed)
Pt arrives via [facility] from home where he had a fall in his bathroom this morning, onto R knee. Pt reports hearing a pop upon falling. Obvious deformity upon ems arrival, splinted on scene. Pulses intact. Pt currently on hospice for ALS. A/ox4. Pt has cpap from home due to anxiety on scene. Pt calm, resp e/u at this time. fentanyl given en route.

## 2020-08-15 NOTE — Progress Notes (Signed)
Orthopedic Tech Progress Note Patient Details:  James Munoz Jan 20, 1961 [id number]  Ortho Devices Type of Ortho Device: Post (long leg) splint, Stirrup splint Ortho Device/Splint Location: Splint applied as directed to in order. 2 EMTs assisted with holding the leg during application. Ortho Device/Splint Interventions: Ordered, Application, Adjustment   Post Interventions Patient Tolerated: Well Instructions Provided: Care of device, Adjustment of device   [name] Post 08/15/2020, 7:21 AM

## 2020-08-15 NOTE — ED Provider Notes (Signed)
7:14 AM Signout from [facility] at shift change.   Spoke with hospice, attempting to arrange respite hospice care.   Case discussed with ortho (Dr. Victorino Dike) earlier. Pt placed in a long leg splint.   8:15 AM Spoke with hospice. They will admit to an emergency respite bed. She asks that he arrive around 11am to [address] Theola Sequin notified.   BP 121/87    Pulse 73    Temp 98.2 F (36.8 C) (Axillary)    Resp 17    SpO2 100%     Renne Crigler, PA-C 08/15/20 1456    Tegeler, Canary Brim, MD 08/15/20 1606

## 2020-12-03 ENCOUNTER — Encounter (HOSPITAL_BASED_OUTPATIENT_CLINIC_OR_DEPARTMENT_OTHER): Payer: Self-pay | Admitting: *Deleted

## 2020-12-03 ENCOUNTER — Emergency Department (HOSPITAL_BASED_OUTPATIENT_CLINIC_OR_DEPARTMENT_OTHER)
Admission: EM | Admit: 2020-12-03 | Discharge: 2020-12-03 | Disposition: A | Discharge status: Home / Self Care | Attending: Emergency Medicine | Admitting: Emergency Medicine

## 2020-12-03 ENCOUNTER — Other Ambulatory Visit: Payer: Self-pay

## 2020-12-03 DIAGNOSIS — H9201 Otalgia, right ear: Secondary | ICD-10-CM | POA: Diagnosis not present

## 2020-12-03 DIAGNOSIS — R030 Elevated blood-pressure reading, without diagnosis of hypertension: Secondary | ICD-10-CM

## 2020-12-03 DIAGNOSIS — G1221 Amyotrophic lateral sclerosis: Secondary | ICD-10-CM

## 2020-12-03 DIAGNOSIS — Z87891 Personal history of nicotine dependence: Secondary | ICD-10-CM | POA: Diagnosis not present

## 2020-12-03 DIAGNOSIS — R519 Headache, unspecified: Secondary | ICD-10-CM | POA: Insufficient documentation

## 2020-12-03 DIAGNOSIS — R21 Rash and other nonspecific skin eruption: Secondary | ICD-10-CM | POA: Diagnosis not present

## 2020-12-03 DIAGNOSIS — J45909 Unspecified asthma, uncomplicated: Secondary | ICD-10-CM | POA: Diagnosis not present

## 2020-12-03 DIAGNOSIS — B0222 Postherpetic trigeminal neuralgia: Secondary | ICD-10-CM

## 2020-12-03 MED ORDER — VALACYCLOVIR HCL 1 G PO TABS: 1000.0000 mg | ORAL_TABLET | Freq: Three times a day (TID) | ORAL | 0 refills | Status: AC

## 2020-12-03 MED ORDER — PREDNISONE 20 MG PO TABS: 60.0000 mg | ORAL_TABLET | Freq: Every day | ORAL | 0 refills | Status: AC

## 2020-12-03 NOTE — ED Provider Notes (Signed)
[facility] EMERGENCY DEPT Provider Note   CSN: [id number] Arrival date & time: 12/03/20  1100     History No chief complaint on file.   Torrell Krutz is a 60 y.o. male.  Patient with hx ALS (dx 2015) c/o right face and ear pain, with red areas/rash. Symptoms acute onset ~ 4 days ago. Has been on cipro. Symptoms acute onset, moderate, constant, persistent. No drainage from ear. No hearing loss. No headache. No neck pain/stiffness. No fever or chills. No trauma/injury to area. No hx same. Has noted red rash to right side of face.   The history is provided by the patient and the spouse.       Past Medical History:  Diagnosis Date  . ALS (amyotrophic lateral sclerosis) (HCC)   . Asthma   . Carpal tunnel syndrome of right wrist 03/2012   weakness and decreased motor skills right hand  . 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 Dental bridge present    lower  . Snores    denies apnea or waking up gasping/choking  . Snoring disorder 07/12/2013    Patient Active Problem List   Diagnosis Date Noted  . OSA on CPAP 11/12/2013  . Abnormal CK 08/17/2013  . Snoring disorder 07/12/2013  . 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 Bilateral neck pain 07/12/2013  . 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 Shortness of breath dyspnea 07/12/2013  . Other dyspnea and respiratory abnormality 07/09/2013  . Dyspnea 07/09/2013    Past Surgical History:  Procedure Laterality Date  . APPENDECTOMY    . CARPAL TUNNEL RELEASE  04/06/2012   Procedure: CARPAL TUNNEL RELEASE;  Surgeon: Wyn Forster., MD;  Location: [facility];  Service: Orthopedics;  Laterality: Right;  . TENDON EXPLORATION  08/21/2012   Procedure: TENDON EXPLORATION;  Surgeon: Wyn Forster., MD;  Location: [facility];  Service: Orthopedics;  Laterality: Right;  Wide Exploration of Carpal Canal , Cyst Excision Right    . ULNAR NERVE TRANSPOSITION  08/21/2012   Procedure: ULNAR  NERVE DECOMPRESSION/TRANSPOSITION;  Surgeon: Wyn Forster., MD;  Location: [facility];  Service: Orthopedics;  Laterality: Right;  Decompression Ulnar Nerve         Family History  Problem Relation Age of Onset  . Other Mother        Living  . Arthritis Mother   . CAD Father        Deceased, 37  . Hepatitis C Sister   . Epilepsy Son   . Healthy Son     Social History   Tobacco Use  . Smoking status: Former Smoker    Packs/day: 1.00    Years: 8.00    Pack years: 8.00    Types: Cigarettes    Quit date: 08/08/1983    Years since quitting: 37.3  . Smokeless tobacco: Never Used  . Tobacco comment: Pt quit in 1980  Substance Use Topics  . Alcohol use: No  . Drug use: No    Home Medications Prior to Admission medications   Medication Sig Start Date End Date Taking? Authorizing Provider  acetaminophen (TYLENOL) 325 MG tablet Take 650 mg by mouth at bedtime.     [provider]  albuterol (PROVENTIL HFA;VENTOLIN HFA) 108 (90 BASE) MCG/ACT inhaler Inhale into the lungs 2 (two) times daily. Patient not taking: Reported on 08/15/2020    [provider]  Cholecalciferol (VITAMIN D-3) 125 MCG (5000 UT) TABS Take 1 tablet by  mouth daily.    [provider]  clonazePAM (KLONOPIN) 1 MG tablet Take 1 mg by mouth at bedtime.    [provider]  Cyanocobalamin (VITAMIN B-12 PO) Take 1 tablet by mouth in the morning.     [provider]  diclofenac (VOLTAREN) 50 MG EC tablet Take 1 tablet (50 mg total) by mouth 2 (two) times daily. Patient not taking: Reported on 08/15/2020 06/05/14   Glendale Chard, DO  doxylamine, Sleep, (UNISOM) 25 MG tablet Take 50 mg by mouth at bedtime.    [provider]  escitalopram (LEXAPRO) 20 MG tablet Take 40 mg by mouth in the morning.    [provider]  gabapentin (NEURONTIN) 100 MG capsule Take 200 mg by mouth at bedtime.    [provider]  gabapentin (NEURONTIN)  300 MG capsule Take 1 capsule (300 mg total) by mouth at bedtime. Patient not taking: Reported on 08/15/2020 05/23/14   Nita Sickle K, DO  gabapentin (NEURONTIN) 800 MG tablet Take 2,400 mg by mouth at bedtime.    [provider]  LORazepam (ATIVAN) 1 MG tablet Take 2 mg by mouth at bedtime.    [provider]  meloxicam (MOBIC) 7.5 MG tablet Take 1 tablet (7.5 mg total) by mouth daily as needed for pain. Patient not taking: Reported on 08/15/2020 06/02/14   Nita Sickle K, DO  Multiple Vitamin (MULTIVITAMIN) tablet Take 1 tablet by mouth daily.    [provider]  omeprazole (PRILOSEC) 40 MG capsule Take 1 capsule (40 mg total) by mouth at bedtime. Patient taking differently: Take 40 mg by mouth in the morning.  08/14/13   Croitoru, Mihai, MD  Probiotic Product (PROBIOTIC DAILY PO) Take 1 tablet by mouth daily.     [provider]    Allergies    Patient has no known allergies.  Review of Systems   Review of Systems  Constitutional: Negative for chills and fever.  HENT: Negative for sore throat and trouble swallowing.   Eyes: Negative for pain and redness.  Respiratory: Negative for shortness of breath.   Cardiovascular: Negative for chest pain.  Gastrointestinal: Negative for vomiting.  Genitourinary: Negative for flank pain.  Musculoskeletal: Negative for neck pain.  Skin: Positive for rash.  Neurological: Negative for headaches.  Hematological: Does not bruise/bleed easily.  Psychiatric/Behavioral: Negative for confusion.    Physical Exam Updated Vital Signs BP (!) 171/96 (BP Location: Right Arm)   Pulse 77   Temp 98.5 F (36.9 C) (Oral)   Resp 16   Ht 1.753 m (5\' 9" )   Wt 90.7 kg   SpO2 95%   BMI 29.53 kg/m   Physical Exam Vitals and nursing note reviewed.  Constitutional:      Appearance: Normal appearance. He is well-developed.  HENT:     Head: Atraumatic.     Right Ear: Tympanic membrane normal.     Ears:     Comments: No  swelling to EAC. Erythematous lesion c/w shingles lesion noted. Tm normal. Mild erythema/swelling to right ear, but no exquisite pain or tenderness to ear or ear canal. Mild preauricular l/a.     Nose: Nose normal.     Mouth/Throat:     Mouth: Mucous membranes are moist.     Pharynx: Oropharynx is clear.     Comments: No mm lesions. Pharynx normal.  Eyes:     General: No scleral icterus.    Conjunctiva/sclera: Conjunctivae normal.  Neck:     Trachea:  No tracheal deviation.     Comments: No stiffness or rigidity.  Cardiovascular:     Rate and Rhythm: Normal rate.     Pulses: Normal pulses.  Pulmonary:     Effort: No accessory muscle usage or respiratory distress.  Abdominal:     General: There is no distension.  Genitourinary:    Comments: No cva tenderness. Musculoskeletal:        General: No swelling.     Cervical back: Normal range of motion and neck supple. No rigidity.  Skin:    General: Skin is warm and dry.     Findings: No rash.     Comments: Few erythematous lesions on right side of face (right V3 distribution)  Neurological:     Mental Status: He is alert.     Comments: Alert, speech clear.   Psychiatric:        Mood and Affect: Mood normal.     ED Results / Procedures / Treatments   Labs (all labs ordered are listed, but only abnormal results are displayed) Labs Reviewed - No data to display  EKG None  Radiology No results found.  Procedures Procedures   Medications Ordered in ED Medications - No data to display  ED Course  I have reviewed the triage vital signs and the nursing notes.  Pertinent labs & imaging results that were available during my care of the patient were reviewed by me and considered in my medical decision making (see chart for details).    MDM Rules/Calculators/A&P                          Reviewed nursing notes and prior charts for additional history.   Exam felt most c/w shingles.   No significant ear/eac tenderness/pain  as one would expected with acute and/or severe OE.    Rx valtrex, prednisone.   Return precautions provided.       Final Clinical Impression(s) / ED Diagnoses Final diagnoses:  None    Rx / DC Orders ED Discharge Orders    None       Cathren Laine, MD 12/03/20 1154

## 2020-12-03 NOTE — Discharge Instructions (Addendum)
It was our pleasure to provide your ER care today - we hope that you feel better.  Take valtrex and prednisone as prescribed.   Take acetaminophen as need.   Your blood pressure is high today - follow up with primary care doctor in the next few weeks.   Return to ER if worse, new symptoms, fevers, severe pain, increased swelling/spreading redness, severe headache, or other concern.

## 2020-12-03 NOTE — ED Triage Notes (Signed)
Earache started Saturday and was placed on Cipro this Sunday by Hospice MD,  Dr. Jed Limerick.

## 2022-07-26 IMAGING — DX DG TIBIA/FIBULA PORT 2V*R*
1 series · 4 of 4 positions shown · non-contrast
Comparison: None.

CLINICAL DATA: Right lower leg deformity

EXAM:
PORTABLE RIGHT TIBIA AND FIBULA - 2 VIEW

[Series 1: leg · 0.14mm/px · 4 of 4 slices shown]
[im 1/4]
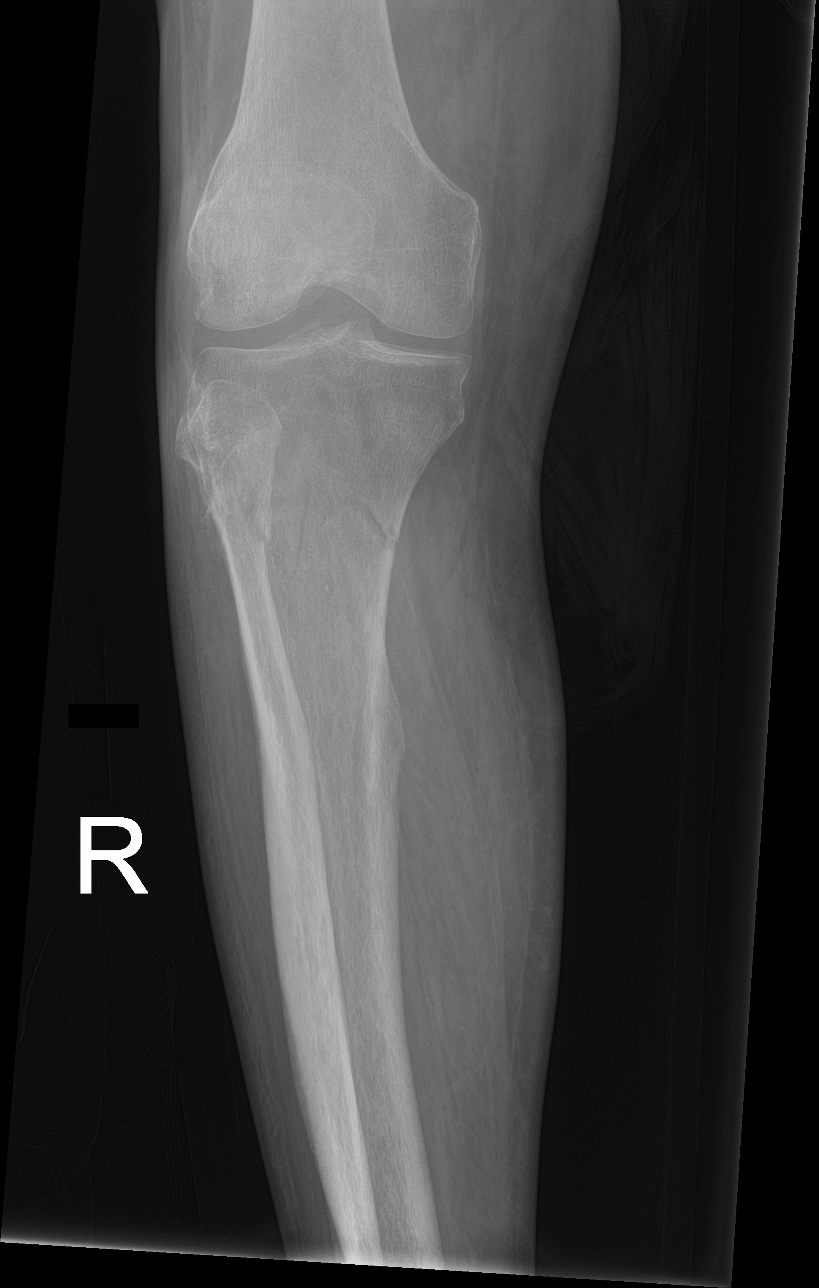
[im 2/4]
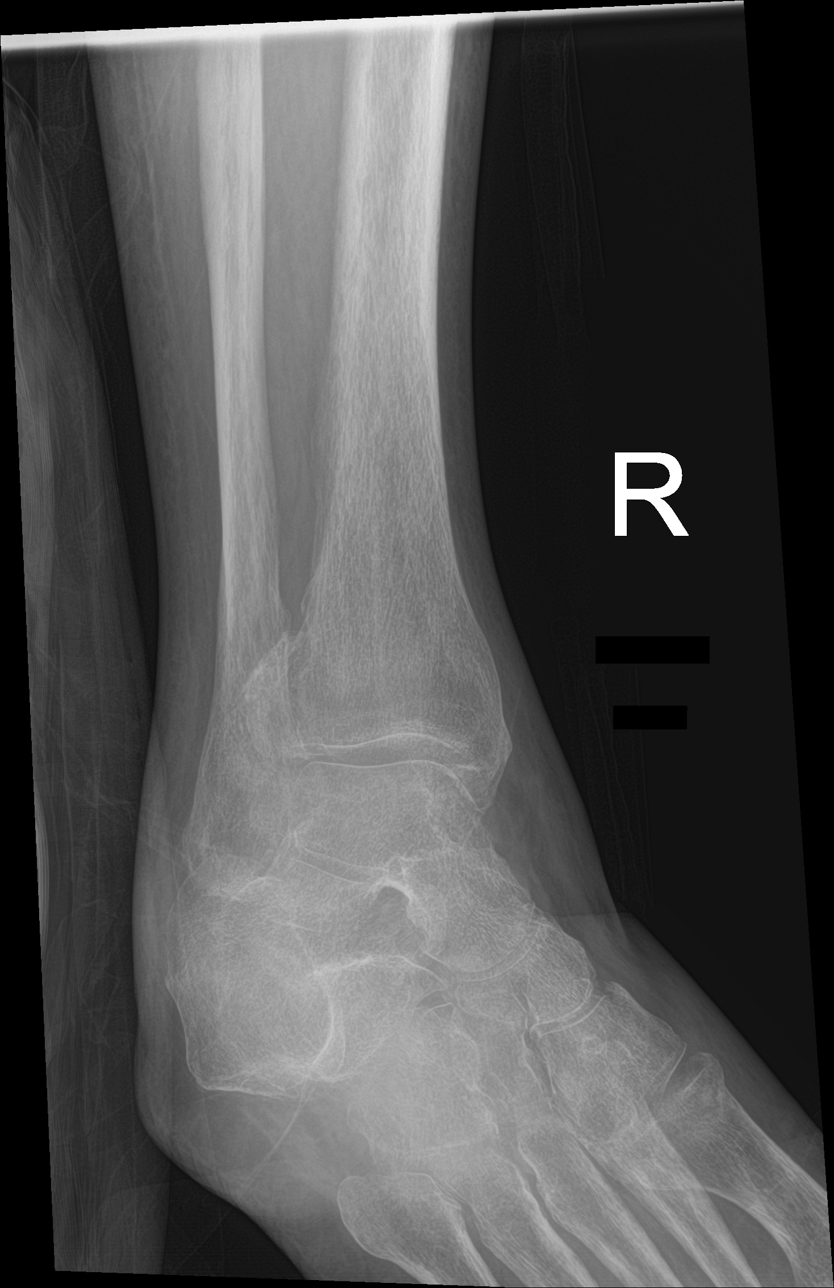
[im 3/4]
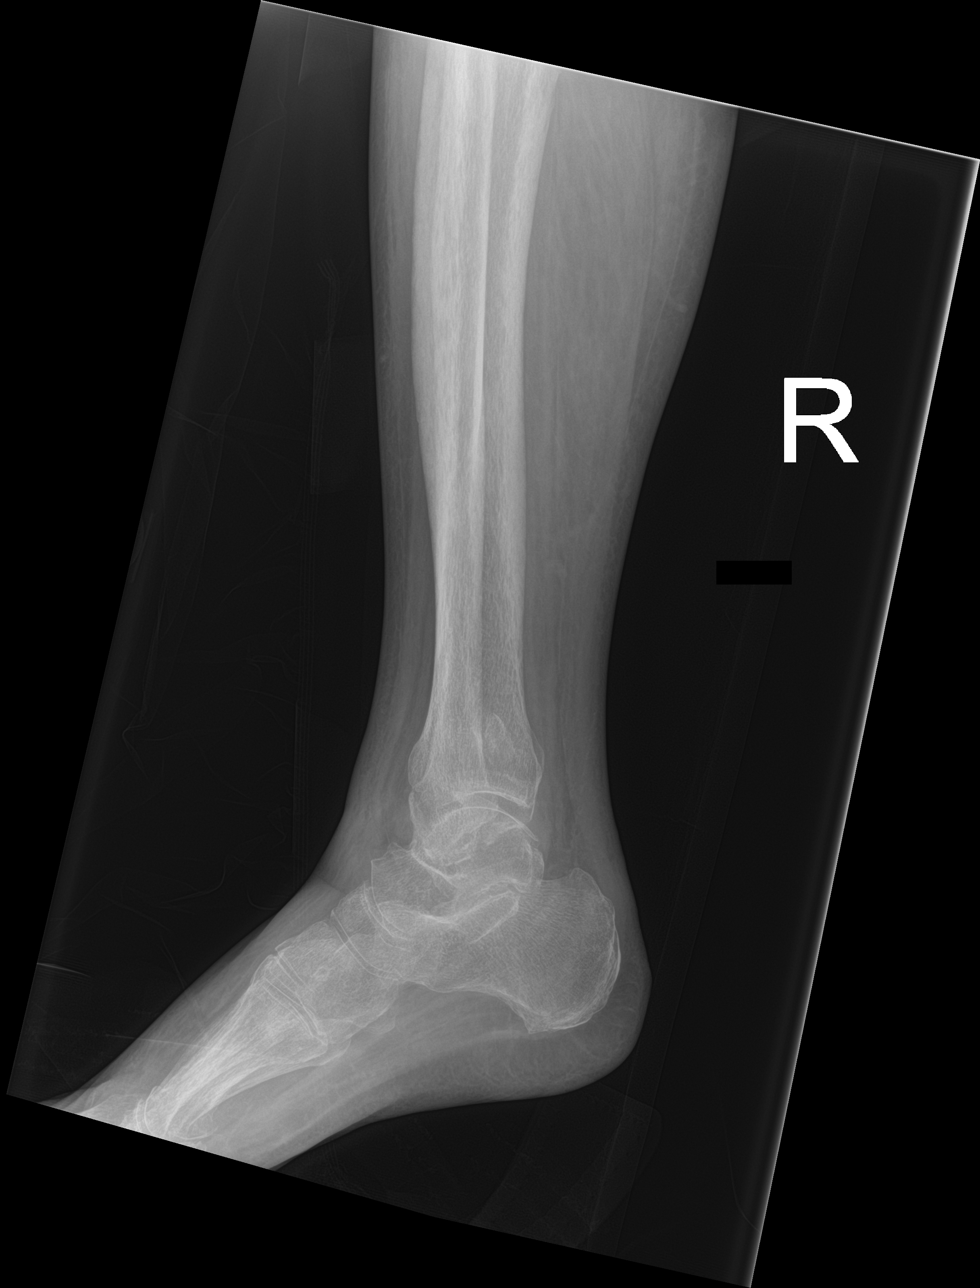
[im 4/4]
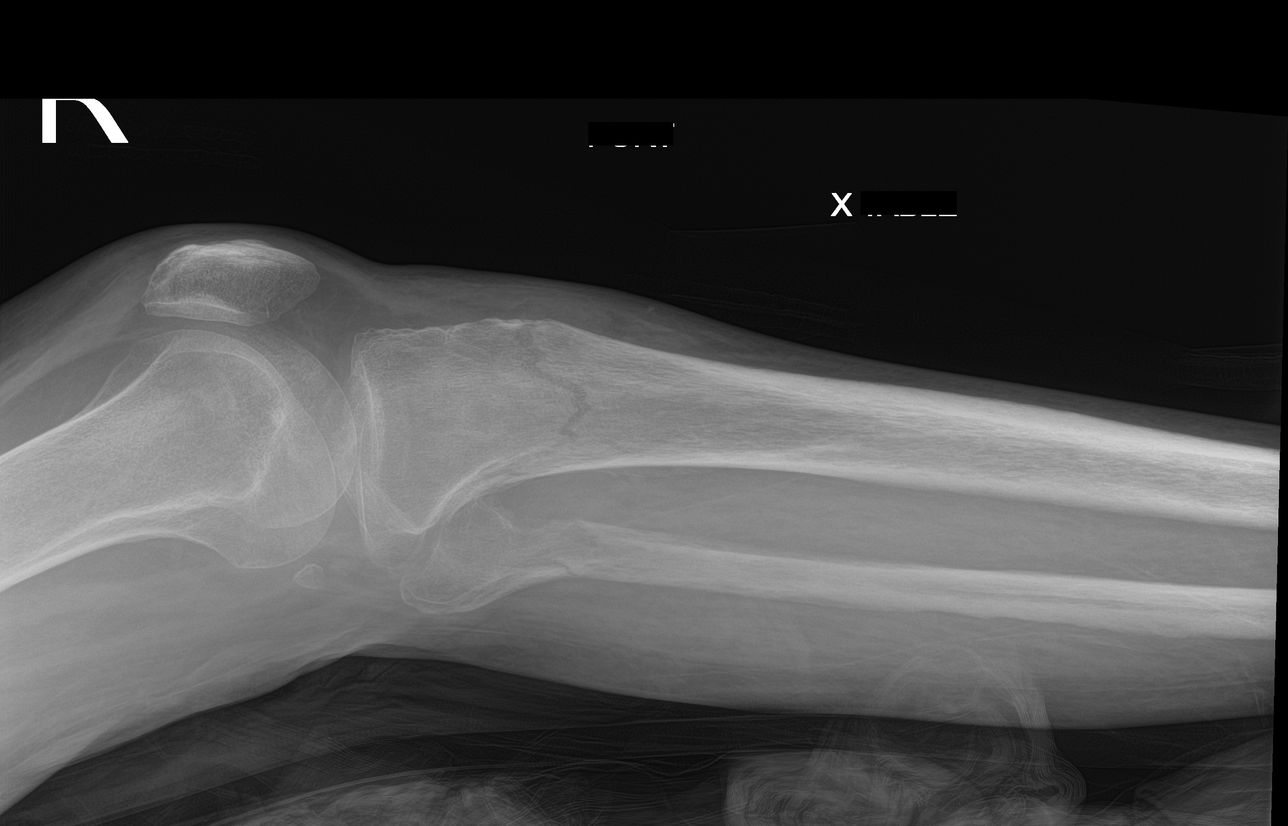

[4 of 4 positions shown; findings below may reference images not displayed]

FINDINGS: Diffuse bone demineralization. Transverse fractures of the proximal
metadiaphysis of the right tibia and fibula without displacement.
Degenerative changes in the right ankle. No focal bone lesions.
IMPRESSION: Transverse nondisplaced fractures of the proximal metadiaphysis of
the right tibia and fibula.

## 2024-03-19 DEATH — deceased
